# Patient Record
Sex: Male | Born: 1990
Health system: Southern US, Community
[De-identification: ages and names within clinical notes are randomized; demographics above are authoritative.]

## PROBLEM LIST (undated history)

## (undated) DIAGNOSIS — K219 Gastro-esophageal reflux disease without esophagitis: Secondary | ICD-10-CM

## (undated) DIAGNOSIS — E785 Hyperlipidemia, unspecified: Secondary | ICD-10-CM

## (undated) DIAGNOSIS — F988 Other specified behavioral and emotional disorders with onset usually occurring in childhood and adolescence: Secondary | ICD-10-CM

## (undated) HISTORY — DX: Gastro-esophageal reflux disease without esophagitis: K21.9

## (undated) HISTORY — DX: Hyperlipidemia, unspecified: E78.5

## (undated) HISTORY — DX: Other specified behavioral and emotional disorders with onset usually occurring in childhood and adolescence: F98.8

---
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
## 2015-03-10 ENCOUNTER — Other Ambulatory Visit: Payer: Self-pay | Admitting: Nurse Practitioner

## 2015-03-10 DIAGNOSIS — K219 Gastro-esophageal reflux disease without esophagitis: Secondary | ICD-10-CM

## 2015-03-27 ENCOUNTER — Ambulatory Visit
Admission: RE | Admit: 2015-03-27 | Discharge: 2015-03-27 | Disposition: A | Discharge status: Home / Self Care | Payer: BLUE CROSS/BLUE SHIELD | Source: Ambulatory Visit | Attending: Nurse Practitioner | Admitting: Nurse Practitioner

## 2015-03-27 DIAGNOSIS — K219 Gastro-esophageal reflux disease without esophagitis: Secondary | ICD-10-CM | POA: Insufficient documentation

## 2016-10-01 IMAGING — RF DG UGI W/O KUB
8 of 9 series · 15 of 16 positions shown · non-contrast
Comparison: None.

CLINICAL DATA: Reflux.

EXAM:
UPPER GI SERIES WITHOUT KUB
TECHNIQUE: Routine upper GI series was performed with thin and high density
barium.
FLUOROSCOPY TIME:  Fluoroscopy Time (in minutes and seconds): 1
minutes 6 seconds
Number of Acquired Images:  16

[Series 1: fluoro_barium 2fps_bw · 0.19mm/px · 4 of 5 frames shown (1 of 8)]
[frame 1/5]
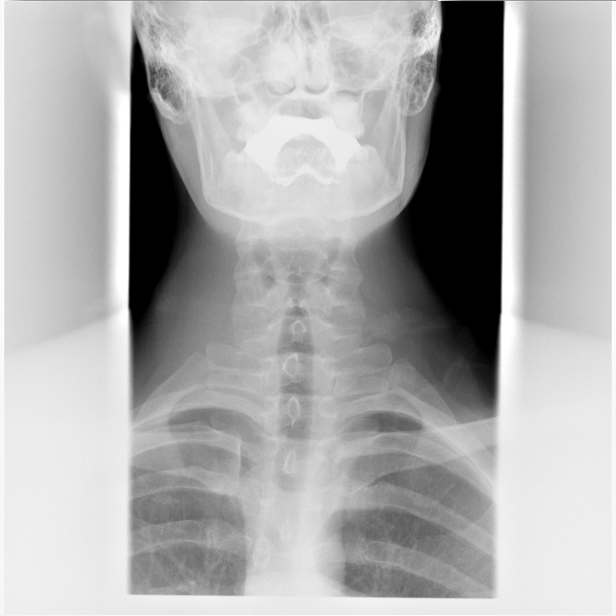
[frame 2/5]
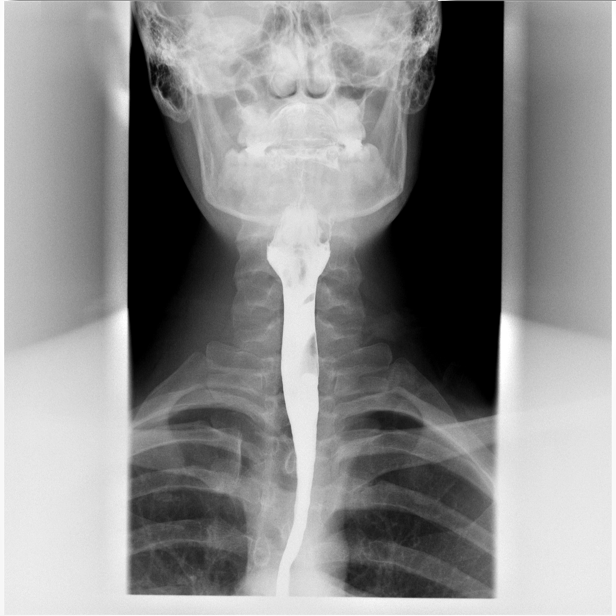
[frame 3/5]
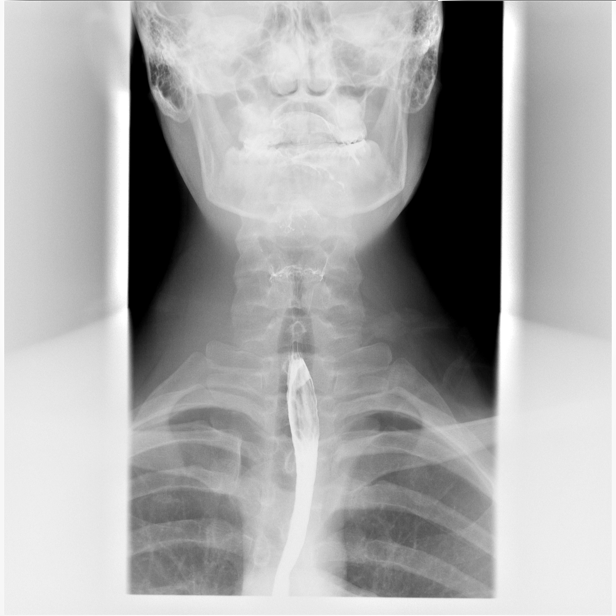
[frame 5/5]
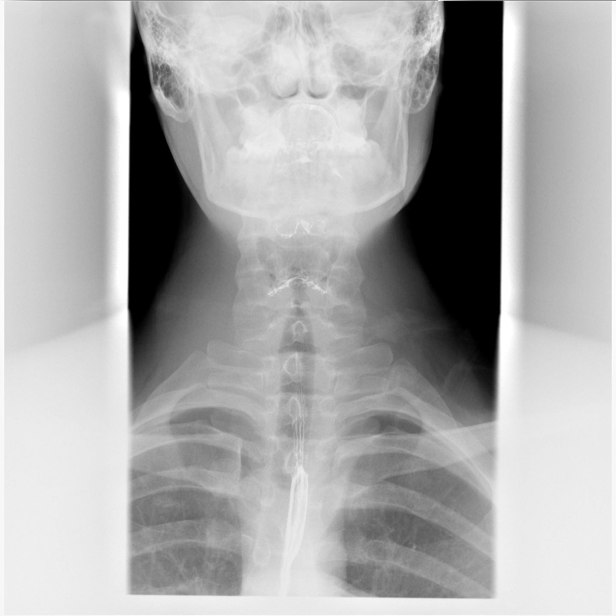

[Series 2: fluoro_barium 2fps_bw · 0.19mm/px · 3 of 5 frames shown (2 of 8)]
[frame 1/5]
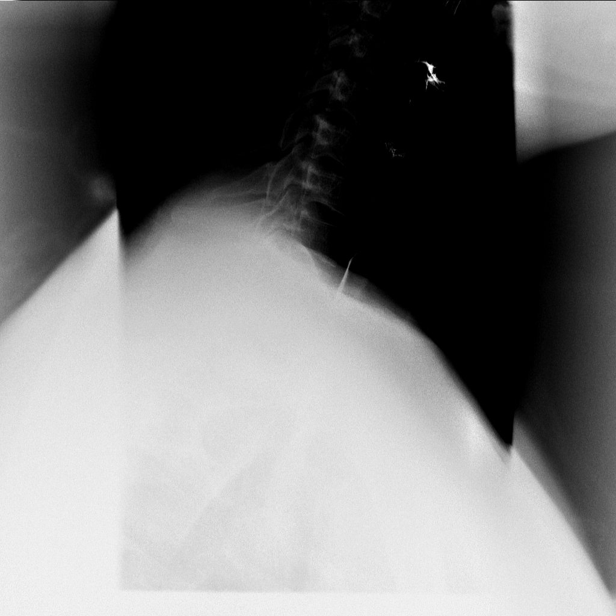
[frame 3/5]
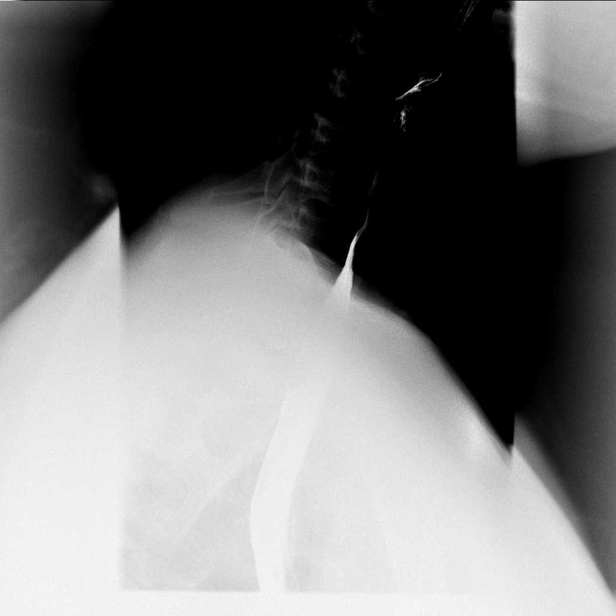
[frame 5/5]
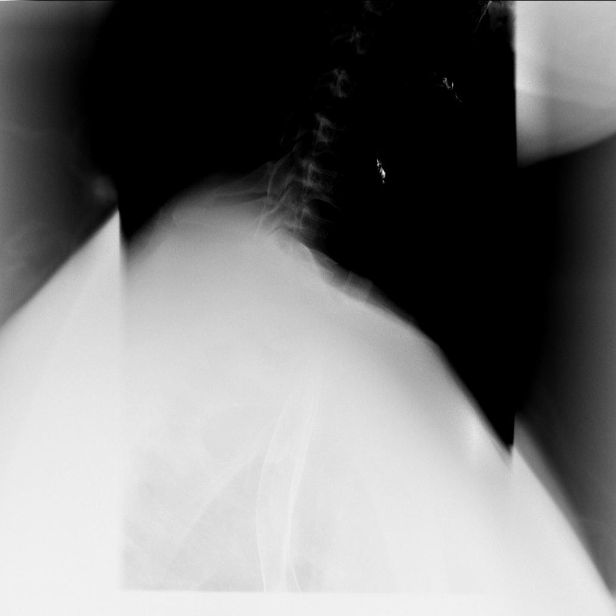

[Series 4: fluoro_barium 2fps_bw · 0.19mm/px · 1 of 1 slices shown (3 of 8)]
[im 1/1]
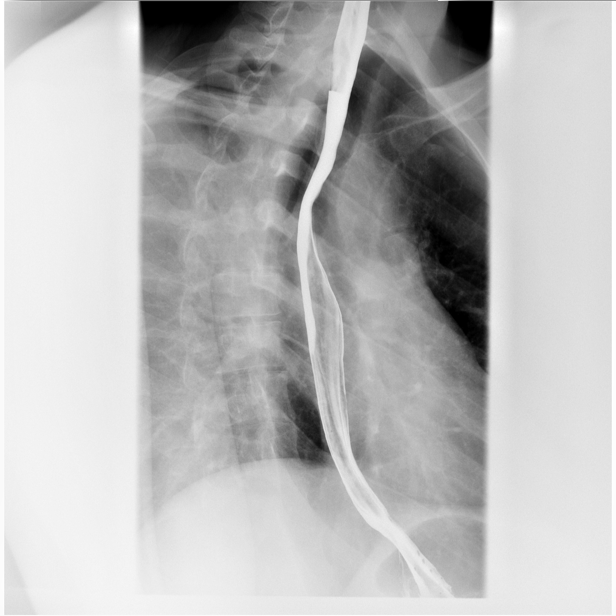

[Series 5: fluoro_barium 2fps_bw · 0.19mm/px · 1 of 1 slices shown (4 of 8)]
[im 1/1]
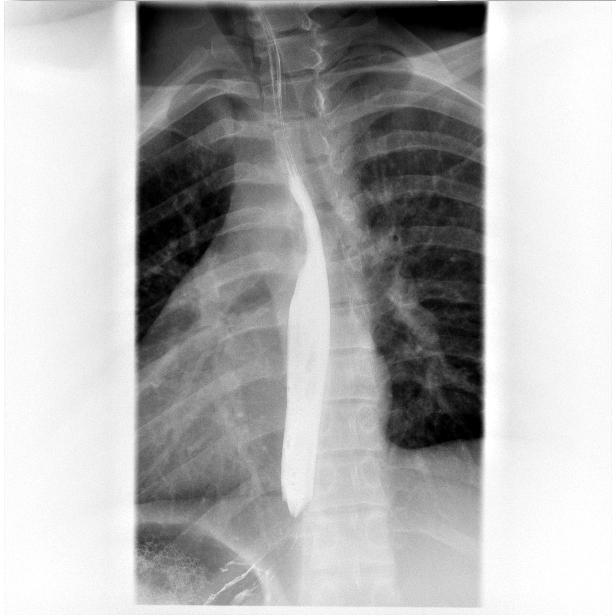

[Series 6: fluoro_barium 2fps_bw · 0.19mm/px · 1 of 1 slices shown (5 of 8)]
[im 1/1]
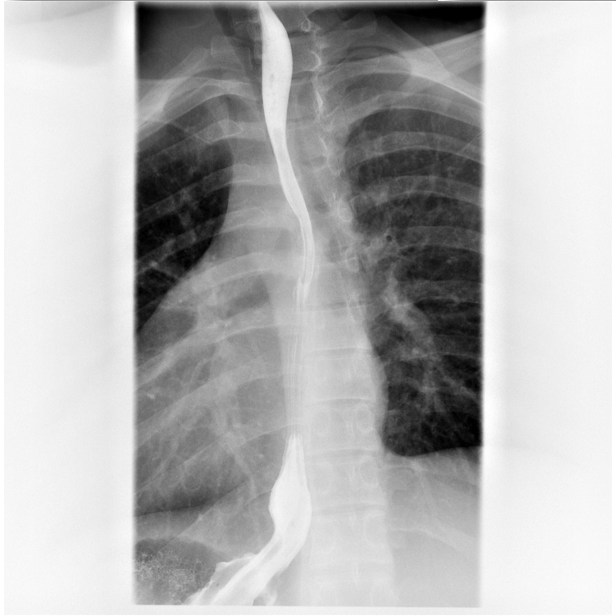

[Series 7: fluoro_barium 2fps_bw · 0.19mm/px · 1 of 1 slices shown (6 of 8)]
[im 1/1]
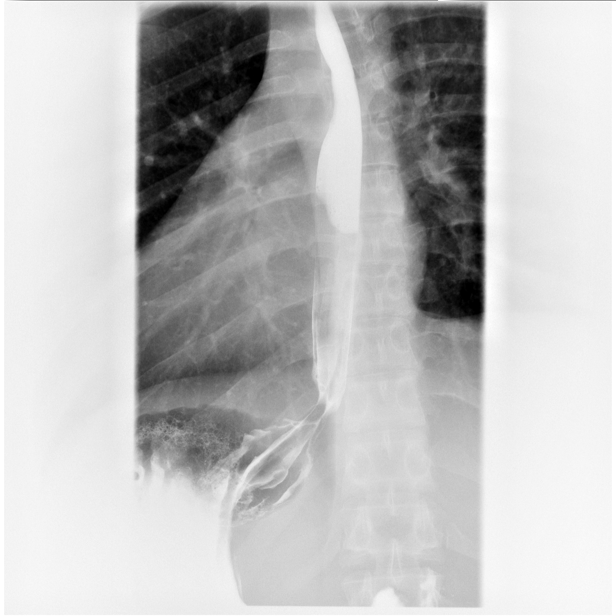

[Series 8: fluoro_barium 2fps_bw · 0.19mm/px · 1 of 1 slices shown (7 of 8)]
[im 1/1]
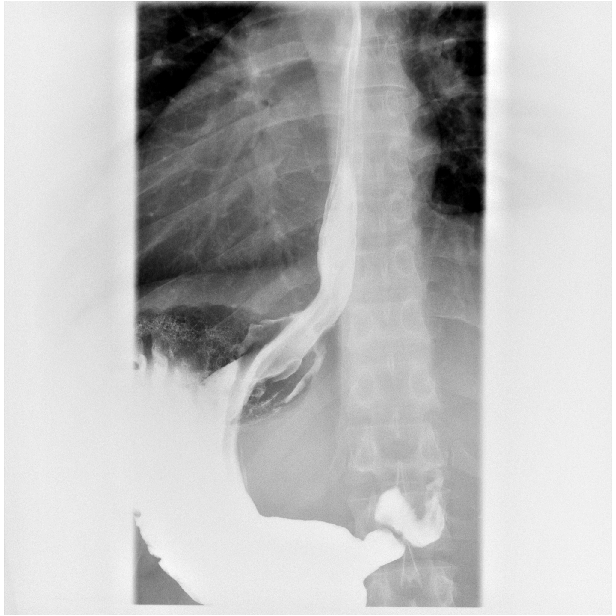

[Series 9: fluoro_barium 2fps_bw · 0.19mm/px · 3 of 3 frames shown (8 of 8)]
[frame 1/3]
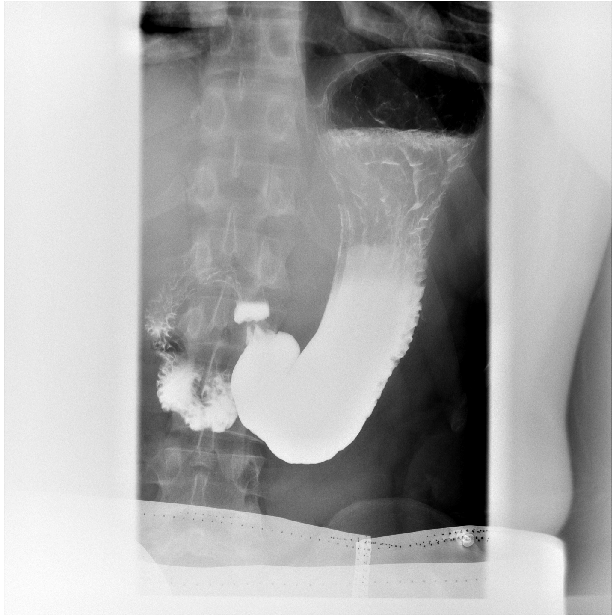
[frame 2/3]
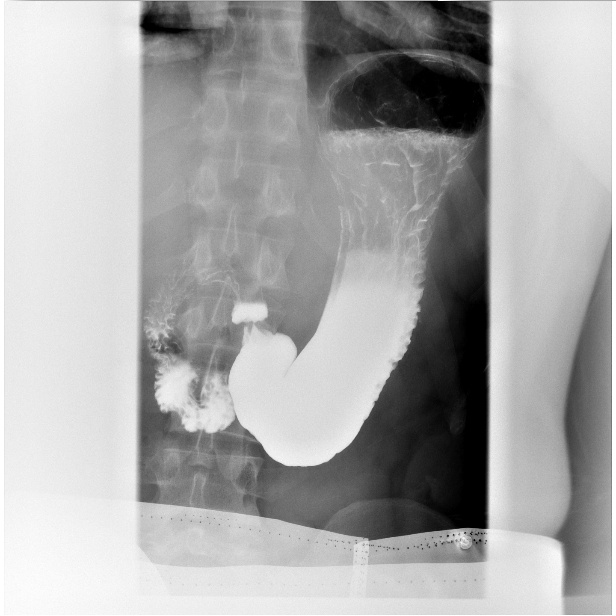
[frame 3/3]
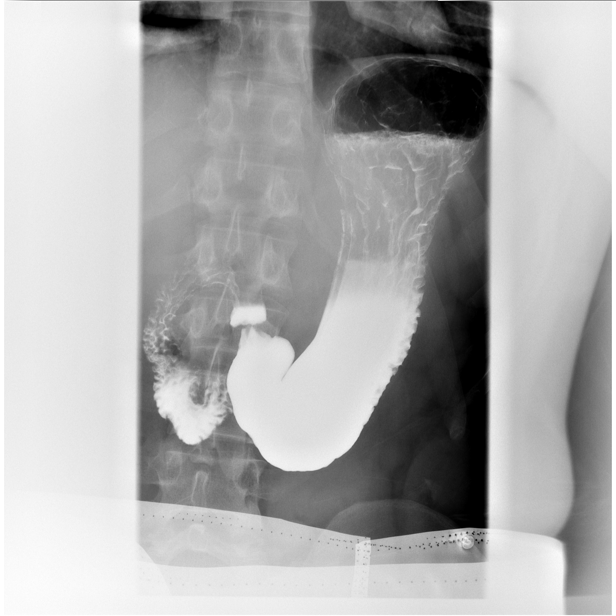

[15 of 16 positions shown; findings below may reference images not displayed]

FINDINGS: Cervical and thoracic esophageal mucosal pattern and peristaltic
activity normal. Contour normal. No focal obstructing abnormality.
No reflux. No hiatal hernia.
IMPRESSION: Normal exam.

## 2016-11-07 DIAGNOSIS — K011 Impacted teeth: Secondary | ICD-10-CM | POA: Diagnosis not present

## 2016-11-29 DIAGNOSIS — F900 Attention-deficit hyperactivity disorder, predominantly inattentive type: Secondary | ICD-10-CM | POA: Diagnosis not present

## 2017-02-27 DIAGNOSIS — K219 Gastro-esophageal reflux disease without esophagitis: Secondary | ICD-10-CM | POA: Diagnosis not present

## 2017-02-27 DIAGNOSIS — F900 Attention-deficit hyperactivity disorder, predominantly inattentive type: Secondary | ICD-10-CM | POA: Diagnosis not present

## 2017-06-10 DIAGNOSIS — J329 Chronic sinusitis, unspecified: Secondary | ICD-10-CM | POA: Diagnosis not present

## 2017-06-10 DIAGNOSIS — B9689 Other specified bacterial agents as the cause of diseases classified elsewhere: Secondary | ICD-10-CM | POA: Diagnosis not present

## 2017-06-18 DIAGNOSIS — Z79891 Long term (current) use of opiate analgesic: Secondary | ICD-10-CM | POA: Diagnosis not present

## 2017-06-18 DIAGNOSIS — K219 Gastro-esophageal reflux disease without esophagitis: Secondary | ICD-10-CM | POA: Diagnosis not present

## 2017-06-18 DIAGNOSIS — F900 Attention-deficit hyperactivity disorder, predominantly inattentive type: Secondary | ICD-10-CM | POA: Diagnosis not present

## 2017-09-22 ENCOUNTER — Encounter: Payer: Self-pay | Admitting: Nurse Practitioner

## 2017-09-22 ENCOUNTER — Ambulatory Visit: Payer: BLUE CROSS/BLUE SHIELD | Admitting: Nurse Practitioner

## 2017-09-22 VITALS — BP 130/80 | HR 90 | Resp 16 | Ht 73.0 in | Wt 216.0 lb

## 2017-09-22 DIAGNOSIS — K219 Gastro-esophageal reflux disease without esophagitis: Secondary | ICD-10-CM

## 2017-09-22 DIAGNOSIS — F988 Other specified behavioral and emotional disorders with onset usually occurring in childhood and adolescence: Secondary | ICD-10-CM | POA: Diagnosis not present

## 2017-09-22 MED ORDER — METHYLPHENIDATE HCL ER (OSM) 54 MG PO TBCR: 54.0000 mg | EXTENDED_RELEASE_TABLET | Freq: Every day | ORAL | 0 refills | Status: DC

## 2017-09-22 NOTE — Progress Notes (Signed)
Subjective:     Patient ID: Gary Price, male   DOB: 01-19-1991, 26 y.o.   MRN: [id number]  The patient is here for medication follow up. Taking concertal ER 54mg  daily. Needs to have new prescriptions for this today. He has no physical concerns or complaints.      Review of Systems  Constitutional: Negative.   HENT: Negative.   Eyes: Negative.   Respiratory: Negative.   Gastrointestinal: Negative for constipation, diarrhea and vomiting.       Does have intermittent acid regflux. Dong well overall. Avoids triggers when possible.   Endocrine: Negative.   Genitourinary: Negative.   Musculoskeletal: Negative.   Skin: Negative.   Allergic/Immunologic: Negative.   Neurological: Negative.   Psychiatric/Behavioral: Positive for decreased concentration.       Objective:   Physical Exam  Constitutional: He appears well-developed and well-nourished.  HENT:  Head: Normocephalic.  Eyes: Pupils are equal, round, and reactive to light.  Neck: Normal range of motion. Neck supple.  Cardiovascular: Normal rate.  Pulmonary/Chest: Effort normal and breath sounds normal.  Abdominal: Soft. Bowel sounds are normal.  Musculoskeletal: Normal range of motion.  Neurological: He is alert.  Skin: Skin is warm and dry.  Psychiatric: He has a normal mood and affect. His behavior is normal.       Assessment:     Attention deficit disorder (ADD) in adult - Plan: methylphenidate 54 MG PO CR tablet, DISCONTINUED: methylphenidate 54 MG PO CR tablet, DISCONTINUED: methylphenidate 54 MG PO CR tablet  GERD without esophagitis     Plan:      1. Add - renew Concerta ER 54mg  daily. Three 30 day prescriptions were sent to the pharmacy - dates are 09/22/2017, 10/21/2017, and 11/19/2017 2. GERD - continue lansopazole 30mg  daily. Use zantac 75mg  twice daily if needed. Updated medication in chart.   Follow up 3 months and sooner if needed

## 2017-12-22 ENCOUNTER — Ambulatory Visit: Payer: Self-pay | Admitting: Nurse Practitioner

## 2018-01-02 ENCOUNTER — Other Ambulatory Visit: Payer: Self-pay | Admitting: Nurse Practitioner

## 2018-01-02 ENCOUNTER — Telehealth: Payer: Self-pay

## 2018-01-02 DIAGNOSIS — F988 Other specified behavioral and emotional disorders with onset usually occurring in childhood and adolescence: Secondary | ICD-10-CM

## 2018-01-02 MED ORDER — METHYLPHENIDATE HCL ER (OSM) 54 MG PO TBCR: 54.0000 mg | EXTENDED_RELEASE_TABLET | Freq: Every day | ORAL | 0 refills | Status: DC

## 2018-01-02 NOTE — Telephone Encounter (Signed)
Renew concerta for 1 month and sent to [facility]

## 2018-01-02 NOTE — Progress Notes (Signed)
Renew concerta for 1 month and sent to [facility]

## 2018-01-02 NOTE — Telephone Encounter (Signed)
Pt advised that rx was sent to pharmacy for his concerta.  dbs

## 2018-01-07 DIAGNOSIS — Z3141 Encounter for fertility testing: Secondary | ICD-10-CM | POA: Diagnosis not present

## 2018-02-03 ENCOUNTER — Ambulatory Visit: Payer: BLUE CROSS/BLUE SHIELD | Admitting: Nurse Practitioner

## 2018-02-03 ENCOUNTER — Encounter: Payer: Self-pay | Admitting: Nurse Practitioner

## 2018-02-03 DIAGNOSIS — K219 Gastro-esophageal reflux disease without esophagitis: Secondary | ICD-10-CM

## 2018-02-03 DIAGNOSIS — F988 Other specified behavioral and emotional disorders with onset usually occurring in childhood and adolescence: Secondary | ICD-10-CM

## 2018-02-03 MED ORDER — METHYLPHENIDATE HCL ER (OSM) 54 MG PO TBCR: 54.0000 mg | EXTENDED_RELEASE_TABLET | Freq: Every day | ORAL | 0 refills | Status: DC

## 2018-02-03 MED ORDER — METHYLPHENIDATE HCL ER (OSM) 54 MG PO TBCR
54.0000 mg | EXTENDED_RELEASE_TABLET | Freq: Every day | ORAL | 0 refills | Status: DC
Start: 2018-02-03 — End: 2018-02-03

## 2018-02-03 NOTE — Progress Notes (Signed)
[facility] [place], [place] [address]  Internal MEDICINE  Office Visit Note  Patient Name: Gary Price  [id number]  [id number]  Date of Service: 02/23/2018   Pt is here for routine follow up.   Chief Complaint  Patient presents with  . Medication Refill    concerta    The patient is currently taking Concerta XR 54mg  daily. This medication helps to keep him focused and on track while at work. 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 Taking concerta helps him to concentrate and do his job in safe manner. He does need o have refills today.   Medication Refill  Pertinent negatives include no abdominal pain, arthralgias, chest pain, chills, congestion, coughing, fatigue, headaches, joint swelling, myalgias, nausea, neck pain, numbness, rash, sore throat or vomiting.       Current Medication: Outpatient Encounter Medications as of 02/03/2018  Medication Sig  . lansoprazole (PREVACID) 30 MG capsule Take 30 mg by mouth daily at 12 noon.  . methylphenidate 54 MG PO CR tablet Take 1 tablet (54 mg total) by mouth daily. Take 1 tab po daily  . ranitidine (ZANTAC) 75 MG tablet Take 75 mg by mouth 2 (two) times daily.  . [DISCONTINUED] methylphenidate 54 MG PO CR tablet Take 1 tablet (54 mg total) by mouth daily. Take 1 tab po daily  . [DISCONTINUED] methylphenidate 54 MG PO CR tablet Take 1 tablet (54 mg total) by mouth daily. Take 1 tab po daily  . [DISCONTINUED] methylphenidate 54 MG PO CR tablet Take 1 tablet (54 mg total) by mouth daily. Take 1 tab po daily   No facility-administered encounter medications on file as of 02/03/2018.     Surgical History: History reviewed. No pertinent surgical history.  Medical History: Past Medical History:  Diagnosis Date  . ADD (attention deficit disorder)   . GERD (gastroesophageal reflux disease)     Family History: Family  History  Problem Relation Age of Onset  . Diabetes Father   . Cancer Paternal Grandmother     Social History   Socioeconomic History  . Marital status: Single    Spouse name: Not on file  . Number of children: Not on file  . Years of education: Not on file  . Highest education level: Not on file  Occupational History  . Not on file  Social Needs  . Financial resource strain: Not on file  . Food insecurity:    Worry: Not on file    Inability: Not on file  . Transportation needs:    Medical: Not on file    Non-medical: Not on file  Tobacco Use  . Smoking status: Never Smoker  . Smokeless tobacco: Never Used  Substance and Sexual Activity  . Alcohol use: No    Frequency: Never  . Drug use: No  . Sexual activity: Not on file  Lifestyle  . Physical activity:    Days per week: Not on file    Minutes per session: Not on file  . Stress: Not on file  Relationships  . Social connections:    Talks on phone: Not on file    Gets together: Not on file    Attends religious service: Not on file    Active member of club or organization: Not on file    Attends meetings of clubs or organizations: Not on file  Relationship status: Not on file  . Intimate partner violence:    Fear of current or ex partner: Not on file    Emotionally abused: Not on file    Physically abused: Not on file    Forced sexual activity: Not on file  Other Topics Concern  . Not on file  Social History Narrative  . Not on file      Review of Systems  Constitutional: Negative for activity change, chills, fatigue and unexpected weight change.  HENT: Negative for congestion, postnasal drip, rhinorrhea, sneezing and sore throat.   Eyes: Negative.  Negative for redness.  Respiratory: Negative for cough, chest tightness, shortness of breath and wheezing.   Cardiovascular: Negative for chest pain and palpitations.  Gastrointestinal: Negative for abdominal pain, constipation, diarrhea, nausea and vomiting.        Intermittent acid reflux.   Endocrine: Negative for cold intolerance, heat intolerance, polydipsia, polyphagia and polyuria.  Genitourinary: Negative for dysuria and frequency.  Musculoskeletal: Negative for arthralgias, back pain, joint swelling, myalgias and neck pain.  Skin: Negative for rash.  Allergic/Immunologic: Negative for environmental allergies.  Neurological: Negative for dizziness, tremors, numbness and headaches.  Hematological: Negative for adenopathy. Does not bruise/bleed easily.  Psychiatric/Behavioral: Positive for decreased concentration. Negative for behavioral problems (Depression), sleep disturbance and suicidal ideas. The patient is not nervous/anxious.     Vital Signs: Today's Vitals   02/03/18 1134  BP: 132/88  Pulse: 88  Resp: 16  SpO2: 95%  Weight: 220 lb 12.8 oz (100.2 kg)  Height: 6\' 3"  (1.905 m)    Physical Exam  Constitutional: He is oriented to person, place, and time. He appears well-developed and well-nourished. No distress.  HENT:  Head: Normocephalic and atraumatic.  Mouth/Throat: Oropharynx is clear and moist. No oropharyngeal exudate.  Eyes: Pupils are equal, round, and reactive to light. EOM are normal.  Neck: Normal range of motion. Neck supple. No JVD present. No tracheal deviation present. No thyromegaly present.  Cardiovascular: Normal rate, regular rhythm and normal heart sounds. Exam reveals no gallop and no friction rub.  No murmur heard. Pulmonary/Chest: Effort normal and breath sounds normal. No respiratory distress. He has no wheezes. He has no rales. He exhibits no tenderness.  Abdominal: Soft. Bowel sounds are normal. There is no tenderness.  Musculoskeletal: Normal range of motion.  Lymphadenopathy:    He has no cervical adenopathy.  Neurological: He is alert and oriented to person, place, and time. No cranial nerve deficit.  Skin: Skin is warm and dry. He is not diaphoretic.  Psychiatric: He has a normal mood and  affect. His behavior is normal. Judgment and thought content normal.  Nursing note and vitals reviewed.  Assessment/Plan: 1. Attention deficit disorder (ADD) in adult Refill concertal 54mg  daily. Three 30 day prescriptions were sent to his pharmacy. Dates are 02/03/2018, 03/03/2018, and 04/01/2018 - methylphenidate 54 MG PO CR tablet; Take 1 tablet (54 mg total) by mouth daily. Take 1 tab po daily  Dispense: 30 tablet; Refill: 0  2. Gastroesophageal reflux disease without esophagitis Continue lansoprazole every day. Use Zantac as needed and as prescribed.   General Counseling: Casimiro Needle understanding of the findings of todays visit and agrees with plan of treatment. I have discussed any further diagnostic evaluation that may be needed or ordered today. We also reviewed his medications today. he has been encouraged to call the office with any questions or concerns that should arise related to todays visit.  This patient was seen by Nira Conn  Junius Creamer, [place] in Collaboration with Dr Lavera Guise as a part of collaborative care agreement  Meds ordered this encounter  Medications  . DISCONTD: methylphenidate 54 MG PO CR tablet    Sig: Take 1 tablet (54 mg total) by mouth daily. Take 1 tab po daily    Dispense:  30 tablet    Refill:  0    Order Specific Question:   Supervising Provider    Answer:   Lavera Guise [[id number]]  . DISCONTD: methylphenidate 54 MG PO CR tablet    Sig: Take 1 tablet (54 mg total) by mouth daily. Take 1 tab po daily    Dispense:  30 tablet    Refill:  0    Fill after 03/03/2018    Order Specific Question:   Supervising Provider    Answer:   Lavera Guise [place]  . methylphenidate 54 MG PO CR tablet    Sig: Take 1 tablet (54 mg total) by mouth daily. Take 1 tab po daily    Dispense:  30 tablet    Refill:  0    Fill after 04/01/2018    Order Specific Question:   Supervising Provider    Answer:   Lavera Guise [[id number]]    Time spent: 17 Minutes     Dr Lavera Guise Internal medicine

## 2018-02-23 ENCOUNTER — Encounter: Payer: Self-pay | Admitting: Nurse Practitioner

## 2018-02-23 DIAGNOSIS — F988 Other specified behavioral and emotional disorders with onset usually occurring in childhood and adolescence: Secondary | ICD-10-CM | POA: Insufficient documentation

## 2018-02-23 DIAGNOSIS — K219 Gastro-esophageal reflux disease without esophagitis: Secondary | ICD-10-CM | POA: Insufficient documentation

## 2018-03-04 DIAGNOSIS — J029 Acute pharyngitis, unspecified: Secondary | ICD-10-CM | POA: Diagnosis not present

## 2018-03-07 DIAGNOSIS — K121 Other forms of stomatitis: Secondary | ICD-10-CM | POA: Diagnosis not present

## 2018-03-24 ENCOUNTER — Other Ambulatory Visit: Payer: Self-pay

## 2018-03-24 MED ORDER — LANSOPRAZOLE 30 MG PO CPDR: 30.0000 mg | DELAYED_RELEASE_CAPSULE | Freq: Every day | ORAL | 5 refills | Status: DC

## 2018-04-17 DIAGNOSIS — S8001XA Contusion of right knee, initial encounter: Secondary | ICD-10-CM | POA: Diagnosis not present

## 2018-04-17 DIAGNOSIS — M25561 Pain in right knee: Secondary | ICD-10-CM | POA: Diagnosis not present

## 2018-05-11 ENCOUNTER — Ambulatory Visit: Payer: BLUE CROSS/BLUE SHIELD | Admitting: Nurse Practitioner

## 2018-05-11 ENCOUNTER — Encounter: Payer: Self-pay | Admitting: Nurse Practitioner

## 2018-05-11 VITALS — BP 143/82 | HR 81 | Resp 16 | Ht 75.0 in | Wt 213.0 lb

## 2018-05-11 DIAGNOSIS — K219 Gastro-esophageal reflux disease without esophagitis: Secondary | ICD-10-CM

## 2018-05-11 DIAGNOSIS — F988 Other specified behavioral and emotional disorders with onset usually occurring in childhood and adolescence: Secondary | ICD-10-CM

## 2018-05-11 MED ORDER — METHYLPHENIDATE HCL ER (OSM) 54 MG PO TBCR: 54.0000 mg | EXTENDED_RELEASE_TABLET | Freq: Every day | ORAL | 0 refills | Status: DC

## 2018-05-11 MED ORDER — METHYLPHENIDATE HCL ER (OSM) 54 MG PO TBCR
54.0000 mg | EXTENDED_RELEASE_TABLET | Freq: Every day | ORAL | 0 refills | Status: DC
Start: 2018-05-11 — End: 2018-07-09

## 2018-05-11 MED ORDER — LANSOPRAZOLE 30 MG PO CPDR: 30.0000 mg | DELAYED_RELEASE_CAPSULE | Freq: Every day | ORAL | 5 refills | Status: DC

## 2018-05-11 NOTE — Progress Notes (Signed)
[facility] [place], [place] [address]  Internal MEDICINE  Office Visit Note  Patient Name: Gary Price  [id number]  [id number]  Date of Service: 05/11/2018  Chief Complaint  Patient presents with  . ADD    medication refill    The patient is currently taking Concerta XR 54mg  daily. This medication helps to keep him focused and on track while at work. 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 Taking concerta helps him to concentrate and do his job in safe manner. He does need o have refills today.   Medication Refill  Pertinent negatives include no abdominal pain, arthralgias, chest pain, chills, congestion, coughing, fatigue, headaches, joint swelling, myalgias, nausea, neck pain, numbness, rash, sore throat or vomiting.       Current Medication: Outpatient Encounter Medications as of 05/11/2018  Medication Sig  . lansoprazole (PREVACID) 30 MG capsule Take 1 capsule (30 mg total) by mouth daily at 12 noon.  . methylphenidate 54 MG PO CR tablet Take 1 tablet (54 mg total) by mouth daily. Take 1 tab po daily  . ranitidine (ZANTAC) 75 MG tablet Take 75 mg by mouth 2 (two) times daily.  . [DISCONTINUED] lansoprazole (PREVACID) 30 MG capsule Take 1 capsule (30 mg total) by mouth daily at 12 noon.  . [DISCONTINUED] methylphenidate 54 MG PO CR tablet Take 1 tablet (54 mg total) by mouth daily. Take 1 tab po daily  . [DISCONTINUED] methylphenidate 54 MG PO CR tablet Take 1 tablet (54 mg total) by mouth daily. Take 1 tab po daily  . [DISCONTINUED] methylphenidate 54 MG PO CR tablet Take 1 tablet (54 mg total) by mouth daily. Take 1 tab po daily   No facility-administered encounter medications on file as of 05/11/2018.     Surgical History: History reviewed. No pertinent surgical history.  Medical History: Past Medical History:  Diagnosis Date  . ADD (attention deficit  disorder)   . GERD (gastroesophageal reflux disease)     Family History: Family History  Problem Relation Age of Onset  . Diabetes Father   . Cancer Paternal Grandmother     Social History   Socioeconomic History  . Marital status: Single    Spouse name: Not on file  . Number of children: Not on file  . Years of education: Not on file  . Highest education level: Not on file  Occupational History  . Not on file  Social Needs  . Financial resource strain: Not on file  . Food insecurity:    Worry: Not on file    Inability: Not on file  . Transportation needs:    Medical: Not on file    Non-medical: Not on file  Tobacco Use  . Smoking status: Never Smoker  . Smokeless tobacco: Never Used  Substance and Sexual Activity  . Alcohol use: No    Frequency: Never  . Drug use: No  . Sexual activity: Not on file  Lifestyle  . Physical activity:    Days per week: Not on file    Minutes per session: Not on file  . Stress: Not on file  Relationships  . Social connections:    Talks on phone: Not on file    Gets together: Not on file    Attends religious service: Not on file    Active member of club or organization: Not on  file    Attends meetings of clubs or organizations: Not on file    Relationship status: Not on file  . Intimate partner violence:    Fear of current or ex partner: Not on file    Emotionally abused: Not on file    Physically abused: Not on file    Forced sexual activity: Not on file  Other Topics Concern  . Not on file  Social History Narrative  . Not on file      Review of Systems  Constitutional: Negative for activity change, chills, fatigue and unexpected weight change.  HENT: Negative for congestion, postnasal drip, rhinorrhea, sneezing and sore throat.   Eyes: Negative.  Negative for redness.  Respiratory: Negative for cough, chest tightness, shortness of breath and wheezing.   Cardiovascular: Negative for chest pain and palpitations.   Gastrointestinal: Negative for abdominal pain, constipation, diarrhea, nausea and vomiting.       Intermittent acid reflux.   Endocrine: Negative for cold intolerance, heat intolerance, polydipsia, polyphagia and polyuria.  Genitourinary: Negative for dysuria and frequency.  Musculoskeletal: Negative for arthralgias, back pain, joint swelling, myalgias and neck pain.  Skin: Negative for rash.  Allergic/Immunologic: Negative for environmental allergies.  Neurological: Negative for dizziness, tremors, numbness and headaches.  Hematological: Negative for adenopathy. Does not bruise/bleed easily.  Psychiatric/Behavioral: Positive for decreased concentration. Negative for behavioral problems (Depression), sleep disturbance and suicidal ideas. The patient is not nervous/anxious.     Vital Signs: BP (!) 143/82 (BP Location: Right Arm, Patient Position: Sitting, Cuff Size: Large)   Pulse 81   Resp 16   Ht 6\' 3"  (1.905 m)   Wt 213 lb (96.6 kg)   SpO2 98%   BMI 26.62 kg/m    Physical Exam  Constitutional: He is oriented to person, place, and time. He appears well-developed and well-nourished. No distress.  HENT:  Head: Normocephalic and atraumatic.  Mouth/Throat: Oropharynx is clear and moist. No oropharyngeal exudate.  Eyes: Pupils are equal, round, and reactive to light. EOM are normal.  Neck: Normal range of motion. Neck supple. No JVD present. No tracheal deviation present. No thyromegaly present.  Cardiovascular: Normal rate, regular rhythm and normal heart sounds. Exam reveals no gallop and no friction rub.  No murmur heard. Pulmonary/Chest: Effort normal and breath sounds normal. No respiratory distress. He has no wheezes. He has no rales. He exhibits no tenderness.  Abdominal: Soft. Bowel sounds are normal. There is no tenderness.  Musculoskeletal: Normal range of motion.  Lymphadenopathy:    He has no cervical adenopathy.  Neurological: He is alert and oriented to person, place,  and time. No cranial nerve deficit.  Skin: Skin is warm and dry. He is not diaphoretic.  Psychiatric: He has a normal mood and affect. His behavior is normal. Judgment and thought content normal.  Nursing note and vitals reviewed.   Assessment/Plan:  1. Gastroesophageal reflux disease without esophagitis Prevacid 30mg  daily. May use zantac as needed for acute GERD symptoms.  - lansoprazole (PREVACID) 30 MG capsule; Take 1 capsule (30 mg total) by mouth daily at 12 noon.  Dispense: 30 capsule; Refill: 5  2. Attention deficit disorder (ADD) in adult conitnue concerta 54mg  daily when needed for work. Three 30 day prescriptions given today. Dates are 05/11/2018, 06/09/2018, and 07/07/2018. - methylphenidate 54 MG PO CR tablet; Take 1 tablet (54 mg total) by mouth daily. Take 1 tab po daily  Dispense: 30 tablet; Refill: 0   General Counseling: Mohammedali verbalizes understanding of the  findings of todays visit and agrees with plan of treatment. I have discussed any further diagnostic evaluation that may be needed or ordered today. We also reviewed his medications today. he has been encouraged to call the office with any questions or concerns that should arise related to todays visit.   This patient was seen by [place] with Dr Lavera Guise as a part of collaborative care agreement  Meds ordered this encounter  Medications  . lansoprazole (PREVACID) 30 MG capsule    Sig: Take 1 capsule (30 mg total) by mouth daily at 12 noon.    Dispense:  30 capsule    Refill:  5    Order Specific Question:   Supervising Provider    Answer:   Lavera Guise [[id number]]  . DISCONTD: methylphenidate 54 MG PO CR tablet    Sig: Take 1 tablet (54 mg total) by mouth daily. Take 1 tab po daily    Dispense:  30 tablet    Refill:  0    Order Specific Question:   Supervising Provider    Answer:   Lavera Guise [[id number]]  . DISCONTD: methylphenidate 54 MG PO CR tablet    Sig: Take 1 tablet (54 mg total)  by mouth daily. Take 1 tab po daily    Dispense:  30 tablet    Refill:  0    Fill after 06/09/2018    Order Specific Question:   Supervising Provider    Answer:   Lavera Guise [place]  . methylphenidate 54 MG PO CR tablet    Sig: Take 1 tablet (54 mg total) by mouth daily. Take 1 tab po daily    Dispense:  30 tablet    Refill:  0    Fill after 07/07/2018    Order Specific Question:   Supervising Provider    Answer:   Lavera Guise [[id number]]    Time spent: 52 Minutes      Dr Lavera Guise Internal medicine

## 2018-05-18 ENCOUNTER — Ambulatory Visit: Payer: Self-pay | Admitting: Adult Health

## 2018-07-09 ENCOUNTER — Telehealth: Payer: Self-pay

## 2018-07-09 ENCOUNTER — Other Ambulatory Visit: Payer: Self-pay | Admitting: Adult Health

## 2018-07-09 DIAGNOSIS — F988 Other specified behavioral and emotional disorders with onset usually occurring in childhood and adolescence: Secondary | ICD-10-CM

## 2018-07-09 MED ORDER — METHYLPHENIDATE HCL ER (OSM) 54 MG PO TBCR: 54.0000 mg | EXTENDED_RELEASE_TABLET | Freq: Every day | ORAL | 0 refills | Status: DC

## 2018-07-09 NOTE — Telephone Encounter (Signed)
Advised pt we send med

## 2018-07-10 ENCOUNTER — Encounter: Payer: Self-pay | Admitting: Adult Health

## 2018-07-10 ENCOUNTER — Ambulatory Visit: Payer: BLUE CROSS/BLUE SHIELD | Admitting: Adult Health

## 2018-07-10 VITALS — BP 138/80 | HR 89 | Resp 16 | Ht 75.0 in | Wt 210.0 lb

## 2018-07-10 DIAGNOSIS — K219 Gastro-esophageal reflux disease without esophagitis: Secondary | ICD-10-CM | POA: Diagnosis not present

## 2018-07-10 DIAGNOSIS — F988 Other specified behavioral and emotional disorders with onset usually occurring in childhood and adolescence: Secondary | ICD-10-CM | POA: Diagnosis not present

## 2018-07-10 MED ORDER — METHYLPHENIDATE HCL ER 54 MG PO TB24: 1.0000 | ORAL_TABLET | Freq: Every day | ORAL | 0 refills | Status: DC

## 2018-07-10 MED ORDER — METHYLPHENIDATE HCL ER (OSM) 54 MG PO TBCR: 54.0000 mg | EXTENDED_RELEASE_TABLET | Freq: Every day | ORAL | 0 refills | Status: DC

## 2018-07-10 NOTE — Progress Notes (Signed)
[facility] [place], [place] [address]  Internal MEDICINE  Office Visit Note  Patient Name: Gary Price  [id number]  [id number]  Date of Service: 07/13/2018  Chief Complaint  Patient presents with  . ADD  . Gastroesophageal Reflux    HPI Pt here for follow up ADD and GERD. He reports he uses Concerta for concentration and attention.  He reports good resolution of symptoms with this medicine.  He has been on the Concerta for at least 20 years. 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  He denies chest pain, palpitations, or headaches from medication.      Current Medication: Outpatient Encounter Medications as of 07/10/2018  Medication Sig  . lansoprazole (PREVACID) 30 MG capsule Take 1 capsule (30 mg total) by mouth daily at 12 noon.  . methylphenidate 54 MG PO CR tablet Take 1 tablet (54 mg total) by mouth daily.  . ranitidine (ZANTAC) 75 MG tablet Take 75 mg by mouth 2 (two) times daily.  . [DISCONTINUED] methylphenidate 54 MG PO CR tablet Take 1 tablet (54 mg total) by mouth daily. Take 1 tab po daily  . [START ON 08/09/2018] Methylphenidate HCl ER 54 MG TB24 Take 1 tablet by mouth daily.  Derrill Memo ON 09/08/2018] Methylphenidate HCl ER 54 MG TB24 Take 1 tablet by mouth daily.   No facility-administered encounter medications on file as of 07/10/2018.     Surgical History: History reviewed. No pertinent surgical history.  Medical History: Past Medical History:  Diagnosis Date  . ADD (attention deficit disorder)   . GERD (gastroesophageal reflux disease)     Family History: Family History  Problem Relation Age of Onset  . Diabetes Father   . Cancer Paternal Grandmother     Social History   Socioeconomic History  . Marital status: Single    Spouse name: Not on file  . Number of children: Not on file  . Years of education: Not on file  . Highest education level: Not on file  Occupational History   . Not on file  Social Needs  . Financial resource strain: Not on file  . Food insecurity:    Worry: Not on file    Inability: Not on file  . Transportation needs:    Medical: Not on file    Non-medical: Not on file  Tobacco Use  . Smoking status: Never Smoker  . Smokeless tobacco: Never Used  Substance and Sexual Activity  . Alcohol use: No    Frequency: Never  . Drug use: No  . Sexual activity: Not on file  Lifestyle  . Physical activity:    Days per week: Not on file    Minutes per session: Not on file  . Stress: Not on file  Relationships  . Social connections:    Talks on phone: Not on file    Gets together: Not on file    Attends religious service: Not on file    Active member of club or organization: Not on file    Attends meetings of clubs or organizations: Not on file    Relationship status: Not on file  . Intimate partner violence:    Fear of current or ex partner: Not on file    Emotionally abused: Not on file    Physically abused: Not on file    Forced sexual activity: Not on file  Other Topics Concern  . Not on file  Social History Narrative  . Not on file      Review of Systems  Constitutional: Negative.  Negative for chills, fatigue and unexpected weight change.  HENT: Negative.  Negative for congestion, rhinorrhea, sneezing and sore throat.   Eyes: Negative for redness.  Respiratory: Negative.  Negative for cough, chest tightness and shortness of breath.   Cardiovascular: Negative.  Negative for chest pain and palpitations.  Gastrointestinal: Negative.  Negative for abdominal pain, constipation, diarrhea, nausea and vomiting.  Endocrine: Negative.   Genitourinary: Negative.  Negative for dysuria and frequency.  Musculoskeletal: Negative.  Negative for arthralgias, back pain, joint swelling and neck pain.  Skin: Negative.  Negative for rash.  Allergic/Immunologic: Negative.   Neurological: Negative.  Negative for tremors and numbness.   Hematological: Negative for adenopathy. Does not bruise/bleed easily.  Psychiatric/Behavioral: Negative.  Negative for behavioral problems, sleep disturbance and suicidal ideas. The patient is not nervous/anxious.     Vital Signs: BP 138/80   Pulse 89   Resp 16   Ht 6\' 3"  (1.905 m)   Wt 210 lb (95.3 kg)   SpO2 94%   BMI 26.25 kg/m    Physical Exam  Constitutional: He is oriented to person, place, and time. He appears well-developed and well-nourished. No distress.  HENT:  Head: Normocephalic and atraumatic.  Mouth/Throat: Oropharynx is clear and moist. No oropharyngeal exudate.  Eyes: Pupils are equal, round, and reactive to light. EOM are normal.  Neck: Normal range of motion. Neck supple. No JVD present. No tracheal deviation present. No thyromegaly present.  Cardiovascular: Normal rate, regular rhythm and normal heart sounds. Exam reveals no gallop and no friction rub.  No murmur heard. Pulmonary/Chest: Effort normal and breath sounds normal. No respiratory distress. He has no wheezes. He has no rales. He exhibits no tenderness.  Abdominal: Soft. There is no tenderness. There is no guarding.  Musculoskeletal: Normal range of motion.  Lymphadenopathy:    He has no cervical adenopathy.  Neurological: He is alert and oriented to person, place, and time. No cranial nerve deficit.  Skin: Skin is warm and dry. He is not diaphoretic.  Psychiatric: He has a normal mood and affect. His behavior is normal. Judgment and thought content normal.  Nursing note and vitals reviewed.   Assessment/Plan: 1. Attention deficit disorder (ADD) in adult Continue Concerta as directed.  - methylphenidate 54 MG PO CR tablet; Take 1 tablet (54 mg total) by mouth daily.  Dispense: 30 tablet; Refill: 0 - Methylphenidate HCl ER 54 MG TB24; Take 1 tablet by mouth daily.  Dispense: 30 tablet; Refill: 0 - Methylphenidate HCl ER 54 MG TB24; Take 1 tablet by mouth daily.  Dispense: 30 tablet; Refill:  0  2. Gastroesophageal reflux disease without esophagitis Continue Prevacid as directed.   General Counseling: Gary Needle understanding of the findings of todays visit and agrees with plan of treatment. I have discussed any further diagnostic evaluation that may be needed or ordered today. We also reviewed his medications today. he has been encouraged to call the office with any questions or concerns that should arise related to todays visit.    No orders of the defined types were placed in this encounter.   Meds ordered this encounter  Medications  . methylphenidate 54 MG PO CR tablet    Sig: Take 1 tablet (54 mg total) by mouth daily.    Dispense:  30 tablet    Refill:  0    Fill after 07/07/2018  . Methylphenidate  HCl ER 54 MG TB24    Sig: Take 1 tablet by mouth daily.    Dispense:  30 tablet    Refill:  0    Do not fill before 08/09/2018  . Methylphenidate HCl ER 54 MG TB24    Sig: Take 1 tablet by mouth daily.    Dispense:  30 tablet    Refill:  0    Do not fill before 09/08/2018    Time spent: 20 Minutes   This patient was seen by Orson Gear AGNP-C in Collaboration with Dr Lavera Guise as a part of collaborative care agreement    Orson Gear [facility] Internal medicine

## 2018-08-10 ENCOUNTER — Ambulatory Visit: Payer: Self-pay | Admitting: Adult Health

## 2018-10-09 ENCOUNTER — Ambulatory Visit: Payer: BLUE CROSS/BLUE SHIELD | Admitting: Adult Health

## 2018-10-09 ENCOUNTER — Encounter: Payer: Self-pay | Admitting: Adult Health

## 2018-10-09 VITALS — BP 134/89 | HR 99 | Resp 16 | Ht 75.0 in | Wt 225.6 lb

## 2018-10-09 DIAGNOSIS — K219 Gastro-esophageal reflux disease without esophagitis: Secondary | ICD-10-CM | POA: Diagnosis not present

## 2018-10-09 DIAGNOSIS — F988 Other specified behavioral and emotional disorders with onset usually occurring in childhood and adolescence: Secondary | ICD-10-CM | POA: Diagnosis not present

## 2018-10-09 MED ORDER — METHYLPHENIDATE HCL ER (OSM) 54 MG PO TBCR: 54.0000 mg | EXTENDED_RELEASE_TABLET | ORAL | 0 refills | Status: DC

## 2018-10-09 MED ORDER — METHYLPHENIDATE HCL ER 54 MG PO TB24: 1.0000 | ORAL_TABLET | Freq: Every day | ORAL | 0 refills | Status: DC

## 2018-10-09 NOTE — Progress Notes (Signed)
[facility] [place], [place] [address]  Internal MEDICINE  Office Visit Note  Patient Name: Gary Price  [id number]  [id number]  Date of Service: 10/10/2018  Chief Complaint  Patient presents with  . ADD  . Gastroesophageal Reflux    HPI Pt is here for follow up on ADD and GERD.  Patient reports his GERD symptoms have been well controlled.  He denies any recent issues.  He continues to take 54 mg of Concerta daily.  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  He reports that his attention and concentration is much improved when taking Concerta and would like to continue it at this time.  He denies any side effects from medication.   Current Medication: Outpatient Encounter Medications as of 10/09/2018  Medication Sig  . lansoprazole (PREVACID) 30 MG capsule Take 1 capsule (30 mg total) by mouth daily at 12 noon.  . methylphenidate 54 MG PO CR tablet Take 1 tablet (54 mg total) by mouth daily.  . Methylphenidate HCl ER 54 MG TB24 Take 1 tablet by mouth daily.  . Methylphenidate HCl ER 54 MG TB24 Take 1 tablet by mouth daily.  . [DISCONTINUED] ranitidine (ZANTAC) 75 MG tablet Take 75 mg by mouth 2 (two) times daily.  . methylphenidate 54 MG PO CR tablet Take 1 tablet (54 mg total) by mouth every morning.  Derrill Memo ON 12/08/2018] methylphenidate 54 MG PO CR tablet Take 1 tablet (54 mg total) by mouth every morning.  Derrill Memo ON 11/08/2018] Methylphenidate HCl ER 54 MG TB24 Take 1 tablet by mouth daily.   No facility-administered encounter medications on file as of 10/09/2018.     Surgical History: History reviewed. No pertinent surgical history.  Medical History: Past Medical History:  Diagnosis Date  . ADD (attention deficit disorder)   . GERD (gastroesophageal reflux disease)     Family History: Family History  Problem Relation Age of Onset  . Diabetes Father   . Cancer Paternal Grandmother     Social  History   Socioeconomic History  . Marital status: Single    Spouse name: Not on file  . Number of children: Not on file  . Years of education: Not on file  . Highest education level: Not on file  Occupational History  . Not on file  Social Needs  . Financial resource strain: Not on file  . Food insecurity:    Worry: Not on file    Inability: Not on file  . Transportation needs:    Medical: Not on file    Non-medical: Not on file  Tobacco Use  . Smoking status: Never Smoker  . Smokeless tobacco: Never Used  Substance and Sexual Activity  . Alcohol use: No    Frequency: Never  . Drug use: No  . Sexual activity: Not on file  Lifestyle  . Physical activity:    Days per week: Not on file    Minutes per session: Not on file  . Stress: Not on file  Relationships  . Social connections:    Talks on phone: Not on file    Gets together: Not on file    Attends religious service: Not on file    Active member of club or organization: Not on file    Attends meetings of clubs or organizations: Not on file    Relationship status: Not on file  . Intimate partner violence:  Fear of current or ex partner: Not on file    Emotionally abused: Not on file    Physically abused: Not on file    Forced sexual activity: Not on file  Other Topics Concern  . Not on file  Social History Narrative  . Not on file      Review of Systems  Constitutional: Negative.  Negative for chills, fatigue and unexpected weight change.  HENT: Negative.  Negative for congestion, rhinorrhea, sneezing and sore throat.   Eyes: Negative for redness.  Respiratory: Negative.  Negative for cough, chest tightness and shortness of breath.   Cardiovascular: Negative.  Negative for chest pain and palpitations.  Gastrointestinal: Negative.  Negative for abdominal pain, constipation, diarrhea, nausea and vomiting.  Endocrine: Negative.   Genitourinary: Negative.  Negative for dysuria and frequency.   Musculoskeletal: Negative.  Negative for arthralgias, back pain, joint swelling and neck pain.  Skin: Negative.  Negative for rash.  Allergic/Immunologic: Negative.   Neurological: Negative.  Negative for tremors and numbness.  Hematological: Negative for adenopathy. Does not bruise/bleed easily.  Psychiatric/Behavioral: Negative.  Negative for behavioral problems, sleep disturbance and suicidal ideas. The patient is not nervous/anxious.     Vital Signs: BP 134/89   Pulse 99   Resp 16   Ht 6\' 3"  (1.905 m)   Wt 225 lb 9.6 oz (102.3 kg)   SpO2 96%   BMI 28.20 kg/m    Physical Exam Vitals signs and nursing note reviewed.  Constitutional:      General: He is not in acute distress.    Appearance: He is well-developed. He is not diaphoretic.  HENT:     Head: Normocephalic and atraumatic.     Mouth/Throat:     Pharynx: No oropharyngeal exudate.  Eyes:     Pupils: Pupils are equal, round, and reactive to light.  Neck:     Musculoskeletal: Normal range of motion and neck supple.     Thyroid: No thyromegaly.     Vascular: No JVD.     Trachea: No tracheal deviation.  Cardiovascular:     Rate and Rhythm: Normal rate and regular rhythm.     Heart sounds: Normal heart sounds. No murmur. No friction rub. No gallop.   Pulmonary:     Effort: Pulmonary effort is normal. No respiratory distress.     Breath sounds: Normal breath sounds. No wheezing or rales.  Chest:     Chest wall: No tenderness.  Abdominal:     Palpations: Abdomen is soft.     Tenderness: There is no abdominal tenderness. There is no guarding.  Musculoskeletal: Normal range of motion.  Lymphadenopathy:     Cervical: No cervical adenopathy.  Skin:    General: Skin is warm and dry.  Neurological:     Mental Status: He is alert and oriented to person, place, and time.     Cranial Nerves: No cranial nerve deficit.  Psychiatric:        Behavior: Behavior normal.        Thought Content: Thought content normal.         Judgment: Judgment normal.    Assessment/Plan: 1. Attention deficit disorder (ADD) in adult 3 Concerta prescription sent to pharmacy for patient. - methylphenidate 54 MG PO CR tablet; Take 1 tablet (54 mg total) by mouth every morning.  Dispense: 30 tablet; Refill: 0 Refilled Controlled medications today. Reviewed risks and possible side effects associated with taking Stimulants. Combination of these drugs with other psychotropic medications could  cause dizziness and drowsiness. Pt needs to Monitor symptoms and exercise caution in driving and operating heavy machinery to avoid damages to oneself, to others and to the surroundings. Patient verbalized understanding in this matter. Dependence and abuse for these drugs will be monitored closely. A Controlled substance policy and procedure is on file which allows [facility] to order a urine drug screen test at any visit. Patient understands and agrees with the plan..  2. Gastroesophageal reflux disease without esophagitis Patient will continue take medications as prescribed.  General Counseling: Casimiro Needle understanding of the findings of todays visit and agrees with plan of treatment. I have discussed any further diagnostic evaluation that may be needed or ordered today. We also reviewed his medications today. he has been encouraged to call the office with any questions or concerns that should arise related to todays visit.    No orders of the defined types were placed in this encounter.   Meds ordered this encounter  Medications  . methylphenidate 54 MG PO CR tablet    Sig: Take 1 tablet (54 mg total) by mouth every morning.    Dispense:  30 tablet    Refill:  0  . Methylphenidate HCl ER 54 MG TB24    Sig: Take 1 tablet by mouth daily.    Dispense:  30 tablet    Refill:  0    Do not fill before 11/08/2018  . methylphenidate 54 MG PO CR tablet    Sig: Take 1 tablet (54 mg total) by mouth every morning.    Dispense:  30  tablet    Refill:  0    Do not fill before 12/08/2018    Time spent: 25 Minutes   This patient was seen by Orson Gear AGNP-C in Collaboration with Dr Lavera Guise as a part of collaborative care agreement     Kendell Bane AGNP-C Internal medicine

## 2018-10-09 NOTE — Patient Instructions (Signed)

## 2018-10-15 DIAGNOSIS — J029 Acute pharyngitis, unspecified: Secondary | ICD-10-CM | POA: Diagnosis not present

## 2018-10-15 DIAGNOSIS — J019 Acute sinusitis, unspecified: Secondary | ICD-10-CM | POA: Diagnosis not present

## 2018-10-15 DIAGNOSIS — R6889 Other general symptoms and signs: Secondary | ICD-10-CM | POA: Diagnosis not present

## 2018-10-15 DIAGNOSIS — J101 Influenza due to other identified influenza virus with other respiratory manifestations: Secondary | ICD-10-CM | POA: Diagnosis not present

## 2018-11-27 ENCOUNTER — Encounter: Payer: Self-pay | Admitting: Adult Health

## 2018-12-08 ENCOUNTER — Telehealth: Payer: Self-pay

## 2018-12-08 ENCOUNTER — Other Ambulatory Visit: Payer: Self-pay | Admitting: Adult Health

## 2018-12-08 DIAGNOSIS — F988 Other specified behavioral and emotional disorders with onset usually occurring in childhood and adolescence: Secondary | ICD-10-CM

## 2018-12-08 MED ORDER — METHYLPHENIDATE HCL ER (OSM) 54 MG PO TBCR: 54.0000 mg | EXTENDED_RELEASE_TABLET | ORAL | 0 refills | Status: DC

## 2018-12-08 NOTE — Telephone Encounter (Signed)
Error

## 2018-12-08 NOTE — Telephone Encounter (Signed)
Pt advised we send med  

## 2018-12-11 ENCOUNTER — Encounter: Payer: Self-pay | Admitting: Adult Health

## 2018-12-11 ENCOUNTER — Ambulatory Visit (INDEPENDENT_AMBULATORY_CARE_PROVIDER_SITE_OTHER): Payer: 59 | Admitting: Adult Health

## 2018-12-11 VITALS — BP 142/98 | HR 98 | Resp 16 | Ht 75.0 in | Wt 229.0 lb

## 2018-12-11 DIAGNOSIS — K219 Gastro-esophageal reflux disease without esophagitis: Secondary | ICD-10-CM

## 2018-12-11 DIAGNOSIS — F988 Other specified behavioral and emotional disorders with onset usually occurring in childhood and adolescence: Secondary | ICD-10-CM | POA: Diagnosis not present

## 2018-12-11 MED ORDER — METHYLPHENIDATE HCL ER (OSM) 54 MG PO TBCR: 54.0000 mg | EXTENDED_RELEASE_TABLET | ORAL | 0 refills | Status: DC

## 2018-12-11 MED ORDER — METHYLPHENIDATE HCL ER 54 MG PO TB24: 1.0000 | ORAL_TABLET | Freq: Every day | ORAL | 0 refills | Status: DC

## 2018-12-11 NOTE — Progress Notes (Signed)
[facility] [place], [place] [address]  Internal MEDICINE  Office Visit Note  Patient Name: Gary Price  [id number]  [id number]  Date of Service: 12/11/2018  Chief Complaint  Patient presents with  . ADD    medication is expensive     HPI  Pt is here for follow up on ADD.  Patient reports he has continued excellent relief of symptoms when taking his Concerta.  Patient has been on Concerta for more than 20 years.  His insurance recently changed and his co-pay is now $100 a month for his Concerta.  We discussed at length switching his medication to another medicine however with him being in school for the next 8 weeks with his new job, he did not find it pertinent to change his medication since we would have to start with a low-dose and move up.  He completes these 8 weeks of training will discuss switching his medication to a more affordable option.   Current Medication: Outpatient Encounter Medications as of 12/11/2018  Medication Sig  . lansoprazole (PREVACID) 30 MG capsule Take 1 capsule (30 mg total) by mouth daily at 12 noon.  . methylphenidate 54 MG PO CR tablet Take 1 tablet (54 mg total) by mouth every morning.  Derrill Memo ON 01/10/2019] Methylphenidate HCl ER 54 MG TB24 Take 1 tablet by mouth daily.  . [DISCONTINUED] methylphenidate 54 MG PO CR tablet Take 1 tablet (54 mg total) by mouth daily.  . [DISCONTINUED] methylphenidate 54 MG PO CR tablet Take 1 tablet (54 mg total) by mouth every morning.  . [DISCONTINUED] methylphenidate 54 MG PO CR tablet Take 1 tablet (54 mg total) by mouth every morning.  . [DISCONTINUED] Methylphenidate HCl ER 54 MG TB24 Take 1 tablet by mouth daily.  . [DISCONTINUED] Methylphenidate HCl ER 54 MG TB24 Take 1 tablet by mouth daily. (Patient not taking: Reported on 12/11/2018)  . [DISCONTINUED] Methylphenidate HCl ER 54 MG TB24 Take 1 tablet by mouth daily. (Patient not taking: Reported on 12/11/2018)   No facility-administered  encounter medications on file as of 12/11/2018.     Surgical History: History reviewed. No pertinent surgical history.  Medical History: Past Medical History:  Diagnosis Date  . ADD (attention deficit disorder)   . GERD (gastroesophageal reflux disease)     Family History: Family History  Problem Relation Age of Onset  . Diabetes Father   . Cancer Paternal Grandmother     Social History   Socioeconomic History  . Marital status: Single    Spouse name: Not on file  . Number of children: Not on file  . Years of education: Not on file  . Highest education level: Not on file  Occupational History  . Not on file  Social Needs  . Financial resource strain: Not on file  . Food insecurity:    Worry: Not on file    Inability: Not on file  . Transportation needs:    Medical: Not on file    Non-medical: Not on file  Tobacco Use  . Smoking status: Never Smoker  . Smokeless tobacco: Never Used  Substance and Sexual Activity  . Alcohol use: No    Frequency: Never  . Drug use: No  . Sexual activity: Not on file  Lifestyle  . Physical activity:    Days per week: Not on file    Minutes per session: Not on file  . Stress: Not on file  Relationships  . Social connections:  Talks on phone: Not on file    Gets together: Not on file    Attends religious service: Not on file    Active member of club or organization: Not on file    Attends meetings of clubs or organizations: Not on file    Relationship status: Not on file  . Intimate partner violence:    Fear of current or ex partner: Not on file    Emotionally abused: Not on file    Physically abused: Not on file    Forced sexual activity: Not on file  Other Topics Concern  . Not on file  Social History Narrative  . Not on file      Review of Systems  Constitutional: Negative.  Negative for chills, fatigue and unexpected weight change.  HENT: Negative.  Negative for congestion, rhinorrhea, sneezing and sore throat.    Eyes: Negative for redness.  Respiratory: Negative.  Negative for cough, chest tightness and shortness of breath.   Cardiovascular: Negative.  Negative for chest pain and palpitations.  Gastrointestinal: Negative.  Negative for abdominal pain, constipation, diarrhea, nausea and vomiting.  Endocrine: Negative.   Genitourinary: Negative.  Negative for dysuria and frequency.  Musculoskeletal: Negative.  Negative for arthralgias, back pain, joint swelling and neck pain.  Skin: Negative.  Negative for rash.  Allergic/Immunologic: Negative.   Neurological: Negative.  Negative for tremors and numbness.  Hematological: Negative for adenopathy. Does not bruise/bleed easily.  Psychiatric/Behavioral: Negative.  Negative for behavioral problems, sleep disturbance and suicidal ideas. The patient is not nervous/anxious.     Vital Signs: BP (!) 142/98   Pulse 98   Resp 16   Ht 6\' 3"  (1.905 m)   Wt 229 lb (103.9 kg)   SpO2 96%   BMI 28.62 kg/m    Physical Exam Vitals signs and nursing note reviewed.  Constitutional:      General: He is not in acute distress.    Appearance: He is well-developed. He is not diaphoretic.  HENT:     Head: Normocephalic and atraumatic.     Mouth/Throat:     Pharynx: No oropharyngeal exudate.  Eyes:     Pupils: Pupils are equal, round, and reactive to light.  Neck:     Musculoskeletal: Normal range of motion and neck supple.     Thyroid: No thyromegaly.     Vascular: No JVD.     Trachea: No tracheal deviation.  Cardiovascular:     Rate and Rhythm: Normal rate and regular rhythm.     Heart sounds: Normal heart sounds. No murmur. No friction rub. No gallop.   Pulmonary:     Effort: Pulmonary effort is normal. No respiratory distress.     Breath sounds: Normal breath sounds. No wheezing or rales.  Chest:     Chest wall: No tenderness.  Abdominal:     Palpations: Abdomen is soft.     Tenderness: There is no abdominal tenderness. There is no guarding.   Musculoskeletal: Normal range of motion.  Lymphadenopathy:     Cervical: No cervical adenopathy.  Skin:    General: Skin is warm and dry.  Neurological:     Mental Status: He is alert and oriented to person, place, and time.     Cranial Nerves: No cranial nerve deficit.  Psychiatric:        Behavior: Behavior normal.        Thought Content: Thought content normal.        Judgment: Judgment normal.  Assessment/Plan: 1. Attention deficit disorder (ADD) in adult Refilled patient's Concerta.  Being filled for 2 months point we will follow-up in 2 months to assess switching his Concerta to another medication that is possibly more horrible on his current insurance. - methylphenidate 54 MG PO CR tablet; Take 1 tablet (54 mg total) by mouth every morning.  Dispense: 30 tablet; Refill: 0 - Methylphenidate HCl ER 54 MG TB24; Take 1 tablet by mouth daily.  Dispense: 30 tablet; Refill: 0  2. Gastroesophageal reflux disease without esophagitis Stable, continue current medication as prescribed.  General Counseling: Casimiro Needle understanding of the findings of todays visit and agrees with plan of treatment. I have discussed any further diagnostic evaluation that may be needed or ordered today. We also reviewed his medications today. he has been encouraged to call the office with any questions or concerns that should arise related to todays visit.    No orders of the defined types were placed in this encounter.   Meds ordered this encounter  Medications  . methylphenidate 54 MG PO CR tablet    Sig: Take 1 tablet (54 mg total) by mouth every morning.    Dispense:  30 tablet    Refill:  0  . Methylphenidate HCl ER 54 MG TB24    Sig: Take 1 tablet by mouth daily.    Dispense:  30 tablet    Refill:  0    Do not fill before 01/10/2019    Time spent: 25 Minutes   This patient was seen by Orson Gear AGNP-C in Collaboration with Dr Lavera Guise as a part of collaborative care  agreement     Kendell Bane AGNP-C Internal medicine

## 2018-12-11 NOTE — Patient Instructions (Signed)
Amphetamine; Dextroamphetamine tablets  What is this medicine?  AMPHETAMINE; DEXTROAMPHETAMINE(am FET a meen; dex troe am FET a meen) is used to treat attention-deficit hyperactivity disorder (ADHD). It may also be used for narcolepsy. Federal law prohibits giving this medicine to any person other than the person for whom it was prescribed. Do not share this medicine with anyone else.  This medicine may be used for other purposes; ask your health care provider or pharmacist if you have questions.  COMMON BRAND NAME(S): Adderall  What should I tell my health care provider before I take this medicine?  They need to know if you have any of these conditions:  -anxiety or panic attacks  -circulation problems in fingers and toes  -glaucoma  -hardening or blockages of the arteries or heart blood vessels  -heart disease or a heart defect  -high blood pressure  -history of a drug or alcohol abuse problem  -history of stroke  -kidney disease  -liver disease  -mental illness  -seizures  -suicidal thoughts, plans, or attempt; a previous suicide attempt by you or a family member  -thyroid disease  -Tourette's syndrome  -an unusual or allergic reaction to dextroamphetamine, other amphetamines, other medicines, foods, dyes, or preservatives  -pregnant or trying to get pregnant  -breast-feeding  How should I use this medicine?  Take this medicine by mouth with a glass of water. Follow the directions on the prescription label. Take your doses at regular intervals. Do not take your medicine more often than directed. Do not suddenly stop your medicine. You must gradually reduce the dose or you may feel withdrawal effects. Ask your doctor or health care professional for advice.  Talk to your pediatrician regarding the use of this medicine in children. Special care may be needed. While this drug may be prescribed for children as young as 3 years for selected conditions, precautions do apply.  Overdosage: If you think you have taken too  much of this medicine contact a poison control center or emergency room at once.  NOTE: This medicine is only for you. Do not share this medicine with others.  What if I miss a dose?  If you miss a dose, take it as soon as you can. If it is almost time for your next dose, take only that dose. Do not take double or extra doses.  What may interact with this medicine?  Do not take this medicine with any of the following medications:  -MAOIs like Carbex, Eldepryl, Marplan, Nardil, and Parnate  -other stimulant medicines for attention disorders  This medicine may also interact with the following medications:  -acetazolamide  -ammonium chloride  -antacids  -ascorbic acid  -atomoxetine  -caffeine  -certain medicines for blood pressure  -certain medicines for depression, anxiety, or psychotic disturbances  -certain medicines for seizures like carbamazepine, phenobarbital, phenytoin  -certain medicines for stomach problems like cimetidine, ranitidine, famotidine, esomeprazole, omeprazole, lansoprazole, pantoprazole  -lithium  -medicines for colds and breathing difficulties  -medicines for diabetes  -medicines or dietary supplements for weight loss or to stay awake  -methenamine  -narcotic medicines for pain  -quinidine  -ritonavir  -sodium bicarbonate  -St. John's wort  This list may not describe all possible interactions. Give your health care provider a list of all the medicines, herbs, non-prescription drugs, or dietary supplements you use. Also tell them if you smoke, drink alcohol, or use illegal drugs. Some items may interact with your medicine.  What should I watch for while using this medicine?    Visit your doctor or health care professional for regular checks on your progress. This prescription requires that you follow special procedures with your doctor and pharmacy. You will need to have a new written prescription from your doctor every time you need a refill.  This medicine may affect your concentration, or hide  signs of tiredness. Until you know how this medicine affects you, do not drive, ride a bicycle, use machinery, or do anything that needs mental alertness.  Tell your doctor or health care professional if this medicine loses its effects, or if you feel you need to take more than the prescribed amount. Do not change the dosage without talking to your doctor or health care professional.  Decreased appetite is a common side effect when starting this medicine. Eating small, frequent meals or snacks can help. Talk to your doctor if you continue to have poor eating habits. Height and weight growth of a child taking this medicine will be monitored closely.  Do not take this medicine close to bedtime. It may prevent you from sleeping.  If you are going to need surgery, a MRI, CT scan, or other procedure, tell your doctor that you are taking this medicine. You may need to stop taking this medicine before the procedure.  Tell your doctor or healthcare professional right away if you notice unexplained wounds on your fingers and toes while taking this medicine. You should also tell your healthcare provider if you experience numbness or pain, changes in the skin color, or sensitivity to temperature in your fingers or toes.  What side effects may I notice from receiving this medicine?  Side effects that you should report to your doctor or health care professional as soon as possible:  -allergic reactions like skin rash, itching or hives, swelling of the face, lips, or tongue  -anxious  -breathing problems  -changes in emotions or moods  -changes in vision  -chest pain or chest tightness  -fast, irregular heartbeat  -fingers or toes feel numb, cool, painful  -hallucination, loss of contact with reality  -high blood pressure  -males: prolonged or painful erection  -seizures  -signs and symptoms of serotonin syndrome like confusion, increased sweating, fever, tremor, stiff muscles, diarrhea  -signs and symptoms of a stroke like  changes in vision; confusion; trouble speaking or understanding; severe headaches; sudden numbness or weakness of the face, arm or leg; trouble walking; dizziness; loss of balance or coordination  -suicidal thoughts or other mood changes  -uncontrollable head, mouth, neck, arm, or leg movements  Side effects that usually do not require medical attention (report to your doctor or health care professional if they continue or are bothersome):  -dry mouth  -headache  -irritability  -loss of appetite  -nausea  -trouble sleeping  -weight loss  This list may not describe all possible side effects. Call your doctor for medical advice about side effects. You may report side effects to FDA at 1-800-FDA-1088.  Where should I keep my medicine?  Keep out of the reach of children. This medicine can be abused. Keep your medicine in a safe place to protect it from theft. Do not share this medicine with anyone. Selling or giving away this medicine is dangerous and against the law.  Store at room temperature between 15 and 30 degrees C (59 and 86 degrees F). Keep container tightly closed. Throw away any unused medicine after the expiration date. Dispose of properly. This medicine may cause accidental overdose and death if it is taken by   other adults, children, or pets. Mix any unused medicine with a substance like cat litter or coffee grounds. Then throw the medicine away in a sealed container like a sealed bag or a coffee can with a lid. Do not use the medicine after the expiration date.  NOTE: This sheet is a summary. It may not cover all possible information. If you have questions about this medicine, talk to your doctor, pharmacist, or health care provider.   2019 Elsevier/Gold Standard (2016-11-15 16:28:23)

## 2019-01-08 ENCOUNTER — Ambulatory Visit: Payer: Self-pay | Admitting: Adult Health

## 2019-02-11 ENCOUNTER — Ambulatory Visit: Payer: 59 | Admitting: Nurse Practitioner

## 2019-02-11 ENCOUNTER — Other Ambulatory Visit: Payer: Self-pay

## 2019-02-11 ENCOUNTER — Encounter: Payer: Self-pay | Admitting: Nurse Practitioner

## 2019-02-11 VITALS — Ht 75.0 in | Wt 225.0 lb

## 2019-02-11 DIAGNOSIS — K219 Gastro-esophageal reflux disease without esophagitis: Secondary | ICD-10-CM

## 2019-02-11 DIAGNOSIS — F988 Other specified behavioral and emotional disorders with onset usually occurring in childhood and adolescence: Secondary | ICD-10-CM | POA: Diagnosis not present

## 2019-02-11 MED ORDER — METHYLPHENIDATE HCL ER (OSM) 54 MG PO TBCR: 54.0000 mg | EXTENDED_RELEASE_TABLET | ORAL | 0 refills | Status: DC

## 2019-02-11 NOTE — Progress Notes (Signed)
[facility] [place], [place] [address]  Internal MEDICINE  Telephone Visit  Patient Name: Gary Price  [id number]  [id number]  Date of Service: 03/01/2019  I connected with the patient at 4:30pm by webcam and verified the patients identity using two identifiers.   I discussed the limitations, risks, security and privacy concerns of performing an evaluation and management service by webcam and the availability of in person appointments. I also discussed with the patient that there may be a patient responsible charge related to the service.  The patient expressed understanding and agrees to proceed.    Chief Complaint  Patient presents with  . Telephone Screen    VIDEO VISIT  . Telephone Assessment  . Medical Management of Chronic Issues    2 month follow up  . Gastroesophageal Reflux  . ADD    The patient has been contacted via webcam for follow up visit due to concerns for spread of novel coronavirus. The patient is currently taking Concerta XR 54mg  daily. This medication helps to keep him focused and on track while at work. 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 Taking concerta helps him to concentrate and do his job in safe manner. He does need to have refills today.        Current Medication: Outpatient Encounter Medications as of 02/11/2019  Medication Sig  . lansoprazole (PREVACID) 30 MG capsule Take 1 capsule (30 mg total) by mouth daily at 12 noon.  . methylphenidate 54 MG PO CR tablet Take 1 tablet (54 mg total) by mouth every morning.  . [DISCONTINUED] methylphenidate 54 MG PO CR tablet Take 1 tablet (54 mg total) by mouth every morning.  . [DISCONTINUED] methylphenidate 54 MG PO CR tablet Take 1 tablet (54 mg total) by mouth every morning.  . [DISCONTINUED] methylphenidate 54 MG PO CR tablet Take 1 tablet (54 mg total) by mouth every morning.  .  [DISCONTINUED] Methylphenidate HCl ER 54 MG TB24 Take 1 tablet by mouth daily.   No facility-administered encounter medications on file as of 02/11/2019.     Surgical History: History reviewed. No pertinent surgical history.  Medical History: Past Medical History:  Diagnosis Date  . ADD (attention deficit disorder)   . GERD (gastroesophageal reflux disease)     Family History: Family History  Problem Relation Age of Onset  . Diabetes Father   . Cancer Paternal Grandmother     Social History   Socioeconomic History  . Marital status: Single    Spouse name: Not on file  . Number of children: Not on file  . Years of education: Not on file  . Highest education level: Not on file  Occupational History  . Not on file  Social Needs  . Financial resource strain: Not on file  . Food insecurity:    Worry: Not on file    Inability: Not on file  . Transportation needs:    Medical: Not on file    Non-medical: Not on file  Tobacco Use  . Smoking status: Never Smoker  . Smokeless tobacco: Never Used  Substance and Sexual Activity  . Alcohol use: No    Frequency: Never  . Drug use: No  . Sexual activity: Not on file  Lifestyle  . Physical activity:    Days per week: Not on file    Minutes per session: Not  on file  . Stress: Not on file  Relationships  . Social connections:    Talks on phone: Not on file    Gets together: Not on file    Attends religious service: Not on file    Active member of club or organization: Not on file    Attends meetings of clubs or organizations: Not on file    Relationship status: Not on file  . Intimate partner violence:    Fear of current or ex partner: Not on file    Emotionally abused: Not on file    Physically abused: Not on file    Forced sexual activity: Not on file  Other Topics Concern  . Not on file  Social History Narrative  . Not on file      Review of Systems  Constitutional: Negative for activity change, chills,  fatigue and unexpected weight change.  HENT: Negative for congestion, postnasal drip, rhinorrhea, sneezing and sore throat.   Respiratory: Negative for cough, chest tightness, shortness of breath and wheezing.   Cardiovascular: Negative for chest pain and palpitations.  Gastrointestinal: Negative for abdominal pain, constipation, diarrhea, nausea and vomiting.       Intermittent acid reflux.   Endocrine: Negative for cold intolerance, heat intolerance, polydipsia, polyphagia and polyuria.  Musculoskeletal: Negative for arthralgias, back pain, joint swelling, myalgias and neck pain.  Skin: Negative for rash.  Allergic/Immunologic: Negative for environmental allergies.  Neurological: Negative for dizziness, tremors, numbness and headaches.  Hematological: Negative for adenopathy. Does not bruise/bleed easily.  Psychiatric/Behavioral: Positive for decreased concentration. Negative for behavioral problems (Depression), sleep disturbance and suicidal ideas. The patient is not nervous/anxious.     Today's Vitals   02/11/19 1614  Weight: 225 lb (102.1 kg)  Height: 6\' 3"  (1.905 m)   Body mass index is 28.12 kg/m.  Observation/Objective:  The patient is alert and oriented. He is pleasant and answering all questions appropriately. Breathing is non-labored. He is in no acute distress.     Assessment/Plan: 1. Gastroesophageal reflux disease without esophagitis Continue prevacid as needed and as prescribed   2. Attention deficit disorder (ADD) in adult May continue concerta 54mg  po qd. Three 30 day prescriptions were sent to his pharmacy. Dates are 02/11/2019, 03/12/2019, and 04/09/2019 - methylphenidate 54 MG PO CR tablet; Take 1 tablet (54 mg total) by mouth every morning.  Dispense: 30 tablet; Refill: 0  General Counseling: Ason verbalizes understanding of the findings of today's phone visit and agrees with plan of treatment. I have discussed any further diagnostic evaluation that may be  needed or ordered today. We also reviewed his medications today. he has been encouraged to call the office with any questions or concerns that should arise related to todays visit.  Refilled Controlled medications today. Reviewed risks and possible side effects associated with taking Stimulants. Combination of these drugs with other psychotropic medications could cause dizziness and drowsiness. Pt needs to Monitor symptoms and exercise caution in driving and operating heavy machinery to avoid damages to oneself, to others and to the surroundings. Patient verbalized understanding in this matter. Dependence and abuse for these drugs will be monitored closely. A Controlled substance policy and procedure is on file which allows [facility] to order a urine drug screen test at any visit. Patient understands and agrees with the plan..  Meds ordered this encounter  Medications  . DISCONTD: methylphenidate 54 MG PO CR tablet    Sig: Take 1 tablet (54 mg total) by mouth every morning.  Dispense:  30 tablet    Refill:  0    Order Specific Question:   Supervising Provider    Answer:   Lavera Guise [[id number]]  . DISCONTD: methylphenidate 54 MG PO CR tablet    Sig: Take 1 tablet (54 mg total) by mouth every morning.    Dispense:  30 tablet    Refill:  0    Fill after 03/12/2019    Order Specific Question:   Supervising Provider    Answer:   Lavera Guise [[id number]]  . methylphenidate 54 MG PO CR tablet    Sig: Take 1 tablet (54 mg total) by mouth every morning.    Dispense:  30 tablet    Refill:  0    Fill after 04/08/2019    Order Specific Question:   Supervising Provider    Answer:   Lavera Guise [[id number]]    Time spent: 69 Minutes    Dr Lavera Guise Internal medicine

## 2019-05-14 ENCOUNTER — Other Ambulatory Visit: Payer: Self-pay

## 2019-05-14 ENCOUNTER — Ambulatory Visit: Payer: 59

## 2019-05-14 ENCOUNTER — Ambulatory Visit (INDEPENDENT_AMBULATORY_CARE_PROVIDER_SITE_OTHER): Payer: 59 | Admitting: Podiatry

## 2019-05-14 ENCOUNTER — Encounter: Payer: Self-pay | Admitting: Podiatry

## 2019-05-14 VITALS — Temp 96.1°F

## 2019-05-14 DIAGNOSIS — M722 Plantar fascial fibromatosis: Secondary | ICD-10-CM

## 2019-05-14 MED ORDER — DICLOFENAC SODIUM 75 MG PO TBEC: 75.0000 mg | DELAYED_RELEASE_TABLET | Freq: Two times a day (BID) | ORAL | 2 refills | Status: DC

## 2019-05-14 MED ORDER — METHYLPREDNISOLONE 4 MG PO TBPK: ORAL_TABLET | ORAL | 0 refills | Status: DC

## 2019-05-16 NOTE — Progress Notes (Signed)
   Subjective: 28 y.o. male presenting today as a new patient with a chief complaint of bilateral heel pain that began about one year ago. He states it feels as if he is walking on a bruise. The pain is worse in the morning and by the end of the day after being on the feet for long periods of time. Patient states the right foot hurts worse than the left. He has been taking Tylenol and stretching the feet which help alleviate the pain. Patient is here for further evaluation and treatment.   Past Medical History:  Diagnosis Date  . ADD (attention deficit disorder)   . GERD (gastroesophageal reflux disease)      Objective: Physical Exam General: The patient is alert and oriented x3 in no acute distress.  Dermatology: Skin is warm, dry and supple bilateral lower extremities. Negative for open lesions or macerations bilateral.   Vascular: Dorsalis Pedis and Posterior Tibial pulses palpable bilateral.  Capillary fill time is immediate to all digits.  Neurological: Epicritic and protective threshold intact bilateral.   Musculoskeletal: Tenderness to palpation to the plantar aspect of the bilateral heels along the plantar fascia. All other joints range of motion within normal limits bilateral. Strength 5/5 in all groups bilateral.   Radiographic exam: Normal osseous mineralization. Joint spaces preserved. No fracture/dislocation/boney destruction. No other soft tissue abnormalities or radiopaque foreign bodies.   Assessment: 1. plantar fasciitis bilateral feet, right greater than left  Plan of Care:  1. Patient evaluated. Xrays reviewed.   2. Injection of 0.5cc Celestone soluspan injected into the right heel.  3. Rx for Medrol Dose Pak placed 4. Rx for Diclofenac ordered for patient. 5. Instructed patient regarding therapies and modalities at home to alleviate symptoms.  6. Return to clinic as needed.  Goes by [employer]. 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     Edrick Kins, DPM [facility]   Dr. Edrick Kins, DPM    [address], [place] [address]                Office [phone]  Fax [phone]

## 2019-05-18 ENCOUNTER — Encounter: Payer: Self-pay | Admitting: Adult Health

## 2019-05-18 ENCOUNTER — Ambulatory Visit: Payer: 59 | Admitting: Adult Health

## 2019-05-18 ENCOUNTER — Other Ambulatory Visit: Payer: Self-pay

## 2019-05-18 DIAGNOSIS — F988 Other specified behavioral and emotional disorders with onset usually occurring in childhood and adolescence: Secondary | ICD-10-CM | POA: Diagnosis not present

## 2019-05-18 DIAGNOSIS — K219 Gastro-esophageal reflux disease without esophagitis: Secondary | ICD-10-CM | POA: Diagnosis not present

## 2019-05-18 MED ORDER — METHYLPHENIDATE HCL ER (OSM) 54 MG PO TBCR: 54.0000 mg | EXTENDED_RELEASE_TABLET | ORAL | 0 refills | Status: DC

## 2019-05-18 NOTE — Progress Notes (Signed)
[facility] [place], [place] [address]  Internal MEDICINE  Telephone Visit  Patient Name: Gary Price  [id number]  [id number]  Date of Service: 05/18/2019  I connected with the patient at 454 by telephone and verified the patients identity using two identifiers.   I discussed the limitations, risks, security and privacy concerns of performing an evaluation and management service by telephone and the availability of in person appointments. I also discussed with the patient that there may be a patient responsible charge related to the service.  The patient expressed understanding and agrees to proceed.    Chief Complaint  Patient presents with  . Telephone Screen  . Medical Management of Chronic Issues  . ADD  . Telephone Assessment    HPI  Pt is seen via telephone for follow up on ADD.  He is doing very well at this time. His current dose of concerta manages his symptoms well.  HE denies any side effects to medications.     Current Medication: Outpatient Encounter Medications as of 05/18/2019  Medication Sig  . diclofenac (VOLTAREN) 75 MG EC tablet Take 1 tablet (75 mg total) by mouth 2 (two) times daily.  . lansoprazole (PREVACID) 30 MG capsule Take 1 capsule (30 mg total) by mouth daily at 12 noon.  . methylphenidate 54 MG PO CR tablet Take 1 tablet (54 mg total) by mouth every morning.  . [DISCONTINUED] methylPREDNISolone (MEDROL DOSEPAK) 4 MG TBPK tablet 6 day dose pack - take as directed (Patient not taking: Reported on 05/18/2019)   No facility-administered encounter medications on file as of 05/18/2019.     Surgical History: History reviewed. No pertinent surgical history.  Medical History: Past Medical History:  Diagnosis Date  . ADD (attention deficit disorder)   . GERD (gastroesophageal reflux disease)     Family History: Family History  Problem Relation Age of Onset  . Diabetes Father   . Cancer Paternal Grandmother     Social  History   Socioeconomic History  . Marital status: Single    Spouse name: Not on file  . Number of children: Not on file  . Years of education: Not on file  . Highest education level: Not on file  Occupational History  . Not on file  Social Needs  . Financial resource strain: Not on file  . Food insecurity    Worry: Not on file    Inability: Not on file  . Transportation needs    Medical: Not on file    Non-medical: Not on file  Tobacco Use  . Smoking status: Never Smoker  . Smokeless tobacco: Never Used  Substance and Sexual Activity  . Alcohol use: No    Frequency: Never  . Drug use: No  . Sexual activity: Not on file  Lifestyle  . Physical activity    Days per week: Not on file    Minutes per session: Not on file  . Stress: Not on file  Relationships  . Social Herbalist on phone: Not on file    Gets together: Not on file    Attends religious service: Not on file    Active member of club or organization: Not on file    Attends meetings of clubs or organizations: Not on file    Relationship status: Not on file  . Intimate partner violence    Fear of current or ex partner: Not on file    Emotionally abused: Not on file  Physically abused: Not on file    Forced sexual activity: Not on file  Other Topics Concern  . Not on file  Social History Narrative  . Not on file      Review of Systems  Constitutional: Negative.  Negative for chills, fatigue and unexpected weight change.  HENT: Negative.  Negative for congestion, rhinorrhea, sneezing and sore throat.   Eyes: Negative for redness.  Respiratory: Negative.  Negative for cough, chest tightness and shortness of breath.   Cardiovascular: Negative.  Negative for chest pain and palpitations.  Gastrointestinal: Negative.  Negative for abdominal pain, constipation, diarrhea, nausea and vomiting.  Endocrine: Negative.   Genitourinary: Negative.  Negative for dysuria and frequency.  Musculoskeletal:  Negative.  Negative for arthralgias, back pain, joint swelling and neck pain.  Skin: Negative.  Negative for rash.  Allergic/Immunologic: Negative.   Neurological: Negative.  Negative for tremors and numbness.  Hematological: Negative for adenopathy. Does not bruise/bleed easily.  Psychiatric/Behavioral: Negative.  Negative for behavioral problems, sleep disturbance and suicidal ideas. The patient is not nervous/anxious.     Vital Signs: There were no vitals taken for this visit.   Observation/Objective: Well appearing NAD noted at this time   Assessment/Plan: 1. Attention deficit disorder (ADD) in adult Refilled Controlled medications today. Reviewed risks and possible side effects associated with taking Stimulants. Combination of these drugs with other psychotropic medications could cause dizziness and drowsiness. Pt needs to Monitor symptoms and exercise caution in driving and operating heavy machinery to avoid damages to oneself, to others and to the surroundings. Patient verbalized understanding in this matter. Dependence and abuse for these drugs will be monitored closely. A Controlled substance policy and procedure is on file which allows [facility] to order a urine drug screen test at any visit. Patient understands and agrees with the plan.. - methylphenidate 54 MG PO CR tablet; Take 1 tablet (54 mg total) by mouth every morning.  Dispense: 30 tablet; Refill: 0 - methylphenidate 54 MG PO CR tablet; Take 1 tablet (54 mg total) by mouth every morning.  Dispense: 30 tablet; Refill: 0  2. GERD without esophagitis Stable, continue current medications.   General Counseling: glennis montenegro understanding of the findings of today's phone visit and agrees with plan of treatment. I have discussed any further diagnostic evaluation that may be needed or ordered today. We also reviewed his medications today. he has been encouraged to call the office with any questions or concerns  that should arise related to todays visit.    No orders of the defined types were placed in this encounter.   No orders of the defined types were placed in this encounter.   Time spent: 11 Minutes    This patient was seen by Orson Gear AGNP-C in Collaboration with Dr Lavera Guise as a part of collaborative care agreement Internal medicine

## 2019-05-21 ENCOUNTER — Ambulatory Visit: Payer: 59 | Admitting: Nurse Practitioner

## 2019-06-02 ENCOUNTER — Other Ambulatory Visit: Payer: 59 | Admitting: Orthotics

## 2019-06-09 ENCOUNTER — Other Ambulatory Visit: Payer: Self-pay

## 2019-06-09 ENCOUNTER — Ambulatory Visit (INDEPENDENT_AMBULATORY_CARE_PROVIDER_SITE_OTHER): Payer: 59 | Admitting: Orthotics

## 2019-06-09 DIAGNOSIS — M722 Plantar fascial fibromatosis: Secondary | ICD-10-CM

## 2019-06-09 NOTE — Progress Notes (Signed)

## 2019-07-07 ENCOUNTER — Other Ambulatory Visit: Payer: Self-pay

## 2019-07-07 ENCOUNTER — Ambulatory Visit: Payer: 59 | Admitting: Orthotics

## 2019-07-07 DIAGNOSIS — M722 Plantar fascial fibromatosis: Secondary | ICD-10-CM

## 2019-07-07 NOTE — Progress Notes (Signed)
Patient came in today to pick up custom made foot orthotics.  The goals were accomplished and the patient reported no dissatisfaction with said orthotics.  Patient was advised of breakin period and how to report any issues. 

## 2019-07-13 ENCOUNTER — Ambulatory Visit: Payer: 59 | Admitting: Nurse Practitioner

## 2019-07-14 ENCOUNTER — Other Ambulatory Visit: Payer: 59 | Admitting: Orthotics

## 2019-07-14 ENCOUNTER — Other Ambulatory Visit: Payer: Self-pay

## 2019-07-14 DIAGNOSIS — F988 Other specified behavioral and emotional disorders with onset usually occurring in childhood and adolescence: Secondary | ICD-10-CM

## 2019-07-14 MED ORDER — METHYLPHENIDATE HCL ER (OSM) 54 MG PO TBCR: 54.0000 mg | EXTENDED_RELEASE_TABLET | ORAL | 0 refills | Status: DC

## 2019-07-16 ENCOUNTER — Ambulatory Visit: Payer: 59 | Admitting: Nurse Practitioner

## 2019-07-26 ENCOUNTER — Ambulatory Visit: Payer: 59 | Admitting: Nurse Practitioner

## 2019-07-30 ENCOUNTER — Encounter: Payer: Self-pay | Admitting: Nurse Practitioner

## 2019-07-30 ENCOUNTER — Other Ambulatory Visit: Payer: Self-pay

## 2019-07-30 ENCOUNTER — Ambulatory Visit: Payer: 59 | Admitting: Nurse Practitioner

## 2019-07-30 VITALS — BP 128/92 | HR 94 | Temp 99.0°F | Resp 16 | Ht 75.0 in | Wt 237.0 lb

## 2019-07-30 DIAGNOSIS — F988 Other specified behavioral and emotional disorders with onset usually occurring in childhood and adolescence: Secondary | ICD-10-CM

## 2019-07-30 DIAGNOSIS — K219 Gastro-esophageal reflux disease without esophagitis: Secondary | ICD-10-CM | POA: Diagnosis not present

## 2019-07-30 MED ORDER — METHYLPHENIDATE HCL ER (OSM) 54 MG PO TBCR: 54.0000 mg | EXTENDED_RELEASE_TABLET | ORAL | 0 refills | Status: DC

## 2019-07-30 NOTE — Progress Notes (Signed)
[facility] [place], [place] [address]  Internal MEDICINE  Office Visit Note  Patient Name: Gary Price  [id number]  [id number]  Date of Service: 08/18/2019  Chief Complaint  Patient presents with  . ADD  . Gastroesophageal Reflux    The patient is currently taking Concerta XR 54mg  daily. This medication helps to keep him focused and on track while at work. 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 Taking concerta helps him to concentrate and do his job in safe manner. He does need o have refills today.   Medication Refill Pertinent negatives include no abdominal pain, arthralgias, chest pain, chills, congestion, coughing, fatigue, headaches, joint swelling, myalgias, nausea, neck pain, numbness, rash, sore throat or vomiting.       Current Medication: Outpatient Encounter Medications as of 07/30/2019  Medication Sig  . diclofenac (VOLTAREN) 75 MG EC tablet Take 1 tablet (75 mg total) by mouth 2 (two) times daily.  . lansoprazole (PREVACID) 30 MG capsule Take 1 capsule (30 mg total) by mouth daily at 12 noon.  . methylphenidate 54 MG PO CR tablet Take 1 tablet (54 mg total) by mouth every morning.  . [DISCONTINUED] methylphenidate 54 MG PO CR tablet Take 1 tablet (54 mg total) by mouth every morning.  . [DISCONTINUED] methylphenidate 54 MG PO CR tablet Take 1 tablet (54 mg total) by mouth every morning.  . [DISCONTINUED] methylphenidate 54 MG PO CR tablet Take 1 tablet (54 mg total) by mouth every morning.  . [DISCONTINUED] methylphenidate 54 MG PO CR tablet Take 1 tablet (54 mg total) by mouth every morning.   No facility-administered encounter medications on file as of 07/30/2019.     Surgical History: History reviewed. No pertinent surgical history.  Medical History: Past Medical History:  Diagnosis Date  . ADD (attention deficit disorder)   . GERD  (gastroesophageal reflux disease)     Family History: Family History  Problem Relation Age of Onset  . Diabetes Father   . Cancer Paternal Grandmother     Social History   Socioeconomic History  . Marital status: Single    Spouse name: Not on file  . Number of children: Not on file  . Years of education: Not on file  . Highest education level: Not on file  Occupational History  . Not on file  Social Needs  . Financial resource strain: Not on file  . Food insecurity    Worry: Not on file    Inability: Not on file  . Transportation needs    Medical: Not on file    Non-medical: Not on file  Tobacco Use  . Smoking status: Never Smoker  . Smokeless tobacco: Never Used  Substance and Sexual Activity  . Alcohol use: No    Frequency: Never  . Drug use: No  . Sexual activity: Not on file  Lifestyle  . Physical activity    Days per week: Not on file    Minutes per session: Not on file  . Stress: Not on file  Relationships  . Social Herbalist on phone: Not on file    Gets together: Not on file    Attends religious service: Not on file    Active member of club or organization: Not on file    Attends meetings of clubs or organizations: Not on file  Relationship status: Not on file  . Intimate partner violence    Fear of current or ex partner: Not on file    Emotionally abused: Not on file    Physically abused: Not on file    Forced sexual activity: Not on file  Other Topics Concern  . Not on file  Social History Narrative  . Not on file      Review of Systems  Constitutional: Negative for activity change, chills, fatigue and unexpected weight change.  HENT: Negative for congestion, postnasal drip, rhinorrhea, sneezing and sore throat.   Respiratory: Negative for cough, chest tightness, shortness of breath and wheezing.   Cardiovascular: Negative for chest pain and palpitations.  Gastrointestinal: Negative for abdominal pain, constipation, diarrhea,  nausea and vomiting.       Intermittent acid reflux.   Endocrine: Negative for cold intolerance, heat intolerance, polydipsia, polyphagia and polyuria.  Musculoskeletal: Negative for arthralgias, back pain, joint swelling, myalgias and neck pain.  Skin: Negative for rash.  Allergic/Immunologic: Negative for environmental allergies.  Neurological: Negative for dizziness, tremors, numbness and headaches.  Hematological: Negative for adenopathy. Does not bruise/bleed easily.  Psychiatric/Behavioral: Positive for decreased concentration. Negative for behavioral problems (Depression), sleep disturbance and suicidal ideas. The patient is not nervous/anxious.    Today's Vitals   07/30/19 1542  BP: (!) 128/92  Pulse: 94  Resp: 16  Temp: 99 F (37.2 C)  SpO2: 97%  Weight: 237 lb (107.5 kg)  Height: 6\' 3"  (1.905 m)   Body mass index is 29.62 kg/m.  Physical Exam Vitals signs and nursing note reviewed.  Constitutional:      General: He is not in acute distress.    Appearance: Normal appearance. He is well-developed. He is not diaphoretic.  HENT:     Head: Normocephalic and atraumatic.     Mouth/Throat:     Pharynx: No oropharyngeal exudate.  Eyes:     Pupils: Pupils are equal, round, and reactive to light.  Neck:     Musculoskeletal: Normal range of motion and neck supple.     Thyroid: No thyromegaly.     Vascular: No JVD.     Trachea: No tracheal deviation.  Cardiovascular:     Rate and Rhythm: Normal rate and regular rhythm.     Heart sounds: Normal heart sounds. No murmur. No friction rub. No gallop.   Pulmonary:     Effort: Pulmonary effort is normal. No respiratory distress.     Breath sounds: Normal breath sounds. No wheezing or rales.  Chest:     Chest wall: No tenderness.  Abdominal:     General: Bowel sounds are normal.     Palpations: Abdomen is soft.     Tenderness: There is no abdominal tenderness.  Musculoskeletal: Normal range of motion.  Lymphadenopathy:      Cervical: No cervical adenopathy.  Skin:    General: Skin is warm and dry.  Neurological:     Mental Status: He is alert and oriented to person, place, and time.     Cranial Nerves: No cranial nerve deficit.  Psychiatric:        Behavior: Behavior normal.        Thought Content: Thought content normal.        Judgment: Judgment normal.    Assessment/Plan: 1. Gastroesophageal reflux disease without esophagitis Well managed. Continue using prilosec as needed   2. Attention deficit disorder (ADD) in adult May continue concerta CR 54mg  daily when needed. Three 30 day prescriptions were  provided. Dates are 07/30/2019, 08/28/2019, and 09/25/2019. - methylphenidate 54 MG PO CR tablet; Take 1 tablet (54 mg total) by mouth every morning.  Dispense: 30 tablet; Refill: 0  General Counseling: Sedric verbalizes understanding of the findings of todays visit and agrees with plan of treatment. I have discussed any further diagnostic evaluation that may be needed or ordered today. We also reviewed his medications today. he has been encouraged to call the office with any questions or concerns that should arise related to todays visit.   This patient was seen by [place] with Dr Lavera Guise as a part of collaborative care agreement  Meds ordered this encounter  Medications  . DISCONTD: methylphenidate 54 MG PO CR tablet    Sig: Take 1 tablet (54 mg total) by mouth every morning.    Dispense:  30 tablet    Refill:  0    Order Specific Question:   Supervising Provider    Answer:   Lavera Guise [id number]  . DISCONTD: methylphenidate 54 MG PO CR tablet    Sig: Take 1 tablet (54 mg total) by mouth every morning.    Dispense:  30 tablet    Refill:  0    Fill after 08/28/2019    Order Specific Question:   Supervising Provider    Answer:   Lavera Guise [id number]  . methylphenidate 54 MG PO CR tablet    Sig: Take 1 tablet (54 mg total) by mouth every morning.    Dispense:  30  tablet    Refill:  0    Fill after 1219/2020    Order Specific Question:   Supervising Provider    Answer:   Lavera Guise [id number]    Time spent: 44 Minutes      Dr Lavera Guise Internal medicine

## 2019-11-15 ENCOUNTER — Other Ambulatory Visit: Payer: Self-pay | Admitting: Nurse Practitioner

## 2019-11-15 ENCOUNTER — Telehealth: Payer: Self-pay

## 2019-11-15 DIAGNOSIS — F988 Other specified behavioral and emotional disorders with onset usually occurring in childhood and adolescence: Secondary | ICD-10-CM

## 2019-11-15 MED ORDER — METHYLPHENIDATE HCL ER (OSM) 54 MG PO TBCR: 54.0000 mg | EXTENDED_RELEASE_TABLET | ORAL | 0 refills | Status: DC

## 2019-11-15 NOTE — Progress Notes (Signed)
Single prescription for concerta sent to [facility]

## 2019-11-15 NOTE — Telephone Encounter (Signed)
SEND MESSAGE TO [facility]

## 2019-11-15 NOTE — Telephone Encounter (Signed)
CONFIRMED AT THE TIME APPT WAS MADE FOR 11-18-19 VIRTUAL VISIT.

## 2019-11-15 NOTE — Telephone Encounter (Signed)
Single prescription for concerta sent to [facility]

## 2019-11-15 NOTE — Telephone Encounter (Signed)
Pt advised we send pres keep appt

## 2019-11-18 ENCOUNTER — Ambulatory Visit: Payer: 59 | Admitting: Nurse Practitioner

## 2019-11-18 ENCOUNTER — Encounter: Payer: Self-pay | Admitting: Nurse Practitioner

## 2019-11-18 VITALS — Ht 75.0 in | Wt 235.0 lb

## 2019-11-18 DIAGNOSIS — F988 Other specified behavioral and emotional disorders with onset usually occurring in childhood and adolescence: Secondary | ICD-10-CM | POA: Diagnosis not present

## 2019-11-18 DIAGNOSIS — K219 Gastro-esophageal reflux disease without esophagitis: Secondary | ICD-10-CM | POA: Diagnosis not present

## 2019-11-18 MED ORDER — METHYLPHENIDATE HCL ER (OSM) 54 MG PO TBCR: 54.0000 mg | EXTENDED_RELEASE_TABLET | ORAL | 0 refills | Status: DC

## 2019-11-18 NOTE — Progress Notes (Signed)
[facility] [place], [place] [address]  Internal MEDICINE  Telephone Visit  Patient Name: Gary Price  [id number]  [id number]  Date of Service: 11/21/2019  I connected with the patient at 4:56pm by webcam and verified the patients identity using two identifiers.   I discussed the limitations, risks, security and privacy concerns of performing an evaluation and management service by webcam and the availability of in person appointments. I also discussed with the patient that there may be a patient responsible charge related to the service.  The patient expressed understanding and agrees to proceed.    Chief Complaint  Patient presents with  . Telephone Assessment  . Telephone Screen  . ADD    The patient has been contacted via webcam for follow up visit due to concerns for spread of novel coronavirus. The patient presents for routine visit. The patient is currently taking Concerta XR 54mg  daily. This medication helps to keep him focused and on track while at work. 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 Taking concerta helps him to concentrate and do his job in safe manner. He does need o have refills today.        Current Medication: Outpatient Encounter Medications as of 11/18/2019  Medication Sig  . diclofenac (VOLTAREN) 75 MG EC tablet Take 1 tablet (75 mg total) by mouth 2 (two) times daily.  . lansoprazole (PREVACID) 30 MG capsule Take 1 capsule (30 mg total) by mouth daily at 12 noon.  . methylphenidate 54 MG PO CR tablet Take 1 tablet (54 mg total) by mouth every morning.  . [DISCONTINUED] methylphenidate 54 MG PO CR tablet Take 1 tablet (54 mg total) by mouth every morning.  . [DISCONTINUED] methylphenidate 54 MG PO CR tablet Take 1 tablet (54 mg total) by mouth every morning.  . [DISCONTINUED] methylphenidate 54 MG PO CR tablet Take 1 tablet (54 mg total)  by mouth every morning.   No facility-administered encounter medications on file as of 11/18/2019.    Surgical History: History reviewed. No pertinent surgical history.  Medical History: Past Medical History:  Diagnosis Date  . ADD (attention deficit disorder)   . GERD (gastroesophageal reflux disease)     Family History: Family History  Problem Relation Age of Onset  . Diabetes Father   . Cancer Paternal Grandmother     Social History   Socioeconomic History  . Marital status: Single    Spouse name: Not on file  . Number of children: Not on file  . Years of education: Not on file  . Highest education level: Not on file  Occupational History  . Not on file  Tobacco Use  . Smoking status: Never Smoker  . Smokeless tobacco: Never Used  Substance and Sexual Activity  . Alcohol use: No  . Drug use: No  . Sexual activity: Not on file  Other Topics Concern  . Not on file  Social History Narrative  . Not on file   Social Determinants of Health   Financial Resource Strain:   . Difficulty of Paying Living Expenses: Not on file  Food Insecurity:   . Worried About Charity fundraiser in the Last Year: Not on file  . Ran Out of Food in the Last Year: Not on file  Transportation Needs:   . Lack of Transportation (Medical): Not on file  .  Lack of Transportation (Non-Medical): Not on file  Physical Activity:   . Days of Exercise per Week: Not on file  . Minutes of Exercise per Session: Not on file  Stress:   . Feeling of Stress : Not on file  Social Connections:   . Frequency of Communication with Friends and Family: Not on file  . Frequency of Social Gatherings with Friends and Family: Not on file  . Attends Religious Services: Not on file  . Active Member of Clubs or Organizations: Not on file  . Attends Archivist Meetings: Not on file  . Marital Status: Not on file  Intimate Partner Violence:   . Fear of Current or Ex-Partner: Not on file  .  Emotionally Abused: Not on file  . Physically Abused: Not on file  . Sexually Abused: Not on file      Review of Systems  Constitutional: Negative for activity change, chills, fatigue and unexpected weight change.  HENT: Negative for congestion, postnasal drip, rhinorrhea, sneezing and sore throat.   Respiratory: Negative for cough, chest tightness, shortness of breath and wheezing.   Cardiovascular: Negative for chest pain and palpitations.  Gastrointestinal: Negative for abdominal pain, constipation, diarrhea, nausea and vomiting.       Intermittent acid reflux.   Endocrine: Negative for cold intolerance, heat intolerance, polydipsia and polyuria.  Musculoskeletal: Negative for arthralgias, back pain, joint swelling, myalgias and neck pain.  Skin: Negative for rash.  Allergic/Immunologic: Negative for environmental allergies.  Neurological: Negative for dizziness, tremors, numbness and headaches.  Hematological: Negative for adenopathy. Does not bruise/bleed easily.  Psychiatric/Behavioral: Positive for decreased concentration. Negative for behavioral problems (Depression), sleep disturbance and suicidal ideas. The patient is not nervous/anxious.     Today's Vitals   11/18/19 1602  Weight: 235 lb (106.6 kg)  Height: 6\' 3"  (1.905 m)   Body mass index is 29.37 kg/m.  Observation/Objective:  The patient is alert and oriented. He is pleasant and answering all questions appropriately. Breathing is non-labored. He is in no acute distress.    Assessment/Plan: 1. Gastroesophageal reflux disease without esophagitis Continue prevacid as prescribed   2. Attention deficit disorder (ADD) in adult May continue to take concerta CR 54mg  daily. Three 30 day prescriptions were sent to his pharmacy. Dates are 12/13/2019, 01/11/2020, and 02/08/2020 - methylphenidate 54 MG PO CR tablet; Take 1 tablet (54 mg total) by mouth every morning.  Dispense: 30 tablet; Refill: 0  General Counseling: Gary Price  verbalizes understanding of the findings of today's phone visit and agrees with plan of treatment. I have discussed any further diagnostic evaluation that may be needed or ordered today. We also reviewed his medications today. he has been encouraged to call the office with any questions or concerns that should arise related to todays visit.   Refilled Controlled medications today. Reviewed risks and possible side effects associated with taking Stimulants. Combination of these drugs with other psychotropic medications could cause dizziness and drowsiness. Pt needs to Monitor symptoms and exercise caution in driving and operating heavy machinery to avoid damages to oneself, to others and to the surroundings. Patient verbalized understanding in this matter. Dependence and abuse for these drugs will be monitored closely. A Controlled substance policy and procedure is on file which allows [facility] to order a urine drug screen test at any visit. Patient understands and agrees with the plan..  This patient was seen by Leretha Pol FNP [facility] with Dr Lavera Guise as a part of collaborative  care agreement  Meds ordered this encounter  Medications  . DISCONTD: methylphenidate 54 MG PO CR tablet    Sig: Take 1 tablet (54 mg total) by mouth every morning.    Dispense:  30 tablet    Refill:  0    Fill after 12/13/2019    Order Specific Question:   Supervising Provider    Answer:   Lavera Guise [id number]  . DISCONTD: methylphenidate 54 MG PO CR tablet    Sig: Take 1 tablet (54 mg total) by mouth every morning.    Dispense:  30 tablet    Refill:  0    Fill after 01/11/2020    Order Specific Question:   Supervising Provider    Answer:   Lavera Guise [id number]  . methylphenidate 54 MG PO CR tablet    Sig: Take 1 tablet (54 mg total) by mouth every morning.    Dispense:  30 tablet    Refill:  0    Fill after 02/08/2020    Order Specific Question:   Supervising Provider    Answer:   Lavera Guise [id number]    Time spent: 53 Minutes    Dr Lavera Guise Internal medicine

## 2019-12-03 ENCOUNTER — Ambulatory Visit: Payer: 59 | Admitting: Nurse Practitioner

## 2020-03-14 ENCOUNTER — Ambulatory Visit: Payer: 59 | Admitting: Nurse Practitioner

## 2020-03-21 ENCOUNTER — Other Ambulatory Visit: Payer: Self-pay | Admitting: Nurse Practitioner

## 2020-03-21 DIAGNOSIS — F988 Other specified behavioral and emotional disorders with onset usually occurring in childhood and adolescence: Secondary | ICD-10-CM

## 2020-03-21 MED ORDER — METHYLPHENIDATE HCL ER (OSM) 54 MG PO TBCR: 54.0000 mg | EXTENDED_RELEASE_TABLET | ORAL | 0 refills | Status: DC

## 2020-03-21 NOTE — Progress Notes (Signed)
Renewed prescription for concerta and sent to his pharmacy.

## 2020-03-28 ENCOUNTER — Telehealth: Payer: Self-pay

## 2020-03-28 NOTE — Telephone Encounter (Signed)
Confirmed and screened for 03-30-20 ov. 

## 2020-03-30 ENCOUNTER — Encounter: Payer: Self-pay | Admitting: Nurse Practitioner

## 2020-03-30 ENCOUNTER — Other Ambulatory Visit: Payer: Self-pay

## 2020-03-30 ENCOUNTER — Ambulatory Visit: Payer: 59 | Admitting: Nurse Practitioner

## 2020-03-30 VITALS — BP 147/99 | HR 98 | Temp 97.2°F | Resp 16 | Ht 75.0 in | Wt 236.2 lb

## 2020-03-30 DIAGNOSIS — K219 Gastro-esophageal reflux disease without esophagitis: Secondary | ICD-10-CM

## 2020-03-30 DIAGNOSIS — F988 Other specified behavioral and emotional disorders with onset usually occurring in childhood and adolescence: Secondary | ICD-10-CM | POA: Diagnosis not present

## 2020-03-30 DIAGNOSIS — R030 Elevated blood-pressure reading, without diagnosis of hypertension: Secondary | ICD-10-CM

## 2020-03-30 MED ORDER — METHYLPHENIDATE HCL ER (OSM) 54 MG PO TBCR: 54.0000 mg | EXTENDED_RELEASE_TABLET | ORAL | 0 refills | Status: DC

## 2020-03-30 NOTE — Progress Notes (Signed)
[facility] [place], [place] [address]  Internal MEDICINE  Office Visit Note  Patient Name: Gary Price  [id number]  [id number]  Date of Service: 04/09/2020  Chief Complaint  Patient presents with  . Follow-up    Would like to have blood work done, has family history of diabetes  . Gastroesophageal Reflux  . ADD    The patient presents for routine visit. The patient is currently taking Concerta XR 54mg  daily. This medication helps to keep him focused and on track while at work. 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 Taking concerta helps him to concentrate and do his job in safe manner. He does need o have refills today.        Current Medication: Outpatient Encounter Medications as of 03/30/2020  Medication Sig  . methylphenidate 54 MG PO CR tablet Take 1 tablet (54 mg total) by mouth every morning.  . [DISCONTINUED] diclofenac (VOLTAREN) 75 MG EC tablet Take 1 tablet (75 mg total) by mouth 2 (two) times daily.  . [DISCONTINUED] lansoprazole (PREVACID) 30 MG capsule Take 1 capsule (30 mg total) by mouth daily at 12 noon.  . [DISCONTINUED] methylphenidate 54 MG PO CR tablet Take 1 tablet (54 mg total) by mouth every morning.  . [DISCONTINUED] methylphenidate 54 MG PO CR tablet Take 1 tablet (54 mg total) by mouth every morning.  . [DISCONTINUED] methylphenidate 54 MG PO CR tablet Take 1 tablet (54 mg total) by mouth every morning.   No facility-administered encounter medications on file as of 03/30/2020.    Surgical History: History reviewed. No pertinent surgical history.  Medical History: Past Medical History:  Diagnosis Date  . ADD (attention deficit disorder)   . GERD (gastroesophageal reflux disease)     Family History: Family History  Problem Relation Age of Onset  . Diabetes Father   . Cancer Paternal Grandmother     Social History    Socioeconomic History  . Marital status: Single    Spouse name: Not on file  . Number of children: Not on file  . Years of education: Not on file  . Highest education level: Not on file  Occupational History  . Not on file  Tobacco Use  . Smoking status: Never Smoker  . Smokeless tobacco: Never Used  Vaping Use  . Vaping Use: Never used  Substance and Sexual Activity  . Alcohol use: No  . Drug use: No  . Sexual activity: Not on file  Other Topics Concern  . Not on file  Social History Narrative  . Not on file   Social Determinants of Health   Financial Resource Strain:   . Difficulty of Paying Living Expenses:   Food Insecurity:   . Worried About Charity fundraiser in the Last Year:   . Arboriculturist in the Last Year:   Transportation Needs:   . Film/video editor (Medical):   Marland Kitchen Lack of Transportation (Non-Medical):   Physical Activity:   . Days of Exercise per Week:   . Minutes of Exercise per Session:   Stress:   . Feeling of Stress :   Social Connections:   . Frequency of Communication with Friends and Family:   . Frequency of Social Gatherings with Friends and Family:   . Attends Religious Services:   . Active Member of Clubs or Organizations:   .  Attends Archivist Meetings:   Marland Kitchen Marital Status:   Intimate Partner Violence:   . Fear of Current or Ex-Partner:   . Emotionally Abused:   Marland Kitchen Physically Abused:   . Sexually Abused:       Review of Systems  Constitutional: Negative for activity change, chills, fatigue and unexpected weight change.  HENT: Negative for congestion, postnasal drip, rhinorrhea, sneezing and sore throat.   Respiratory: Negative for cough, chest tightness, shortness of breath and wheezing.   Cardiovascular: Negative for chest pain and palpitations.  Gastrointestinal: Negative for abdominal pain, constipation, diarrhea, nausea and vomiting.       Intermittent acid reflux.   Endocrine: Negative for cold intolerance,  heat intolerance, polydipsia and polyuria.  Musculoskeletal: Negative for arthralgias, back pain, joint swelling, myalgias and neck pain.  Skin: Negative for rash.  Allergic/Immunologic: Negative for environmental allergies.  Neurological: Negative for dizziness, tremors, numbness and headaches.  Hematological: Negative for adenopathy. Does not bruise/bleed easily.  Psychiatric/Behavioral: Positive for decreased concentration. Negative for behavioral problems (Depression), sleep disturbance and suicidal ideas. The patient is not nervous/anxious.     Today's Vitals   03/30/20 1546  BP: (!) 147/99  Pulse: 98  Resp: 16  Temp: (!) 97.2 F (36.2 C)  SpO2: 96%  Weight: 236 lb 3.2 oz (107.1 kg)  Height: 6\' 3"  (1.905 m)   Body mass index is 29.52 kg/m.  Physical Exam Vitals and nursing note reviewed.  Constitutional:      General: He is not in acute distress.    Appearance: Normal appearance. He is well-developed. He is not diaphoretic.  HENT:     Head: Normocephalic and atraumatic.     Mouth/Throat:     Pharynx: No oropharyngeal exudate.  Eyes:     Pupils: Pupils are equal, round, and reactive to light.  Neck:     Thyroid: No thyromegaly.     Vascular: No JVD.     Trachea: No tracheal deviation.  Cardiovascular:     Rate and Rhythm: Normal rate and regular rhythm.     Heart sounds: Normal heart sounds. No murmur heard.  No friction rub. No gallop.   Pulmonary:     Effort: Pulmonary effort is normal. No respiratory distress.     Breath sounds: Normal breath sounds. No wheezing or rales.  Chest:     Chest wall: No tenderness.  Abdominal:     Palpations: Abdomen is soft.  Musculoskeletal:        General: Normal range of motion.     Cervical back: Normal range of motion and neck supple.  Lymphadenopathy:     Cervical: No cervical adenopathy.  Skin:    General: Skin is warm and dry.  Neurological:     Mental Status: He is alert and oriented to person, place, and time.      Cranial Nerves: No cranial nerve deficit.  Psychiatric:        Mood and Affect: Mood normal.        Behavior: Behavior normal.        Thought Content: Thought content normal.        Judgment: Judgment normal.    Assessment/Plan:  1. Elevated blood pressure reading without diagnosis of hypertension Continue to monitor closely.   2. Gastroesophageal reflux disease without esophagitis Continue to take prevacid as needed and as prescribed   3. Attention deficit disorder (ADD) in adult May continue to take concerta 54mg  daily. Three 30 day prescriptions provided.  Dates are  03/30/2020, 04/27/2020, and 05/26/2020 - methylphenidate 54 MG PO CR tablet; Take 1 tablet (54 mg total) by mouth every morning.  Dispense: 30 tablet; Refill: 0  General Counseling: Dovber verbalizes understanding of the findings of todays visit and agrees with plan of treatment. I have discussed any further diagnostic evaluation that may be needed or ordered today. We also reviewed his medications today. he has been encouraged to call the office with any questions or concerns that should arise related to todays visit.   Refilled Controlled medications today. Reviewed risks and possible side effects associated with taking Stimulants. Combination of these drugs with other psychotropic medications could cause dizziness and drowsiness. Pt needs to Monitor symptoms and exercise caution in driving and operating heavy machinery to avoid damages to oneself, to others and to the surroundings. Patient verbalized understanding in this matter. Dependence and abuse for these drugs will be monitored closely. A Controlled substance policy and procedure is on file which allows [facility] to order a urine drug screen test at any visit. Patient understands and agrees with the plan..  This patient was seen by Leretha Pol FNP Collaboration with Dr Lavera Guise as a part of collaborative care agreement  Meds ordered this encounter   Medications  . DISCONTD: methylphenidate 54 MG PO CR tablet    Sig: Take 1 tablet (54 mg total) by mouth every morning.    Dispense:  30 tablet    Refill:  0    Order Specific Question:   Supervising Provider    Answer:   Lavera Guise [[id number]]  . DISCONTD: methylphenidate 54 MG PO CR tablet    Sig: Take 1 tablet (54 mg total) by mouth every morning.    Dispense:  30 tablet    Refill:  0    Fill after 04/27/2020    Order Specific Question:   Supervising Provider    Answer:   Lavera Guise [[id number]]  . methylphenidate 54 MG PO CR tablet    Sig: Take 1 tablet (54 mg total) by mouth every morning.    Dispense:  30 tablet    Refill:  0    Fill after 05/26/2020    Order Specific Question:   Supervising Provider    Answer:   Lavera Guise [[id number]]    Total time spent: 20 Minutes   Time spent includes review of chart, medications, test results, and follow up plan with the patient.      Dr Lavera Guise Internal medicine

## 2020-04-09 DIAGNOSIS — R030 Elevated blood-pressure reading, without diagnosis of hypertension: Secondary | ICD-10-CM | POA: Insufficient documentation

## 2020-04-19 ENCOUNTER — Other Ambulatory Visit: Payer: Self-pay | Admitting: Nurse Practitioner

## 2020-04-20 LAB — CBC
Hematocrit: 44.9 % (ref 37.5–51.0)
Hemoglobin: 15.5 g/dL (ref 13.0–17.7)
MCH: 28.9 pg (ref 26.6–33.0)
MCHC: 34.5 g/dL (ref 31.5–35.7)
MCV: 84 fL (ref 79–97)
Platelets: 312 10*3/uL (ref 150–450)
RBC: 5.36 x10E6/uL (ref 4.14–5.80)
RDW: 12.8 % (ref 11.6–15.4)
WBC: 6.8 10*3/uL (ref 3.4–10.8)

## 2020-04-20 LAB — COMPREHENSIVE METABOLIC PANEL
ALT: 65 IU/L — ABNORMAL HIGH (ref 0–44)
AST: 23 IU/L (ref 0–40)
Albumin/Globulin Ratio: 2 (ref 1.2–2.2)
Albumin: 4.7 g/dL (ref 4.1–5.2)
Alkaline Phosphatase: 120 IU/L (ref 48–121)
BUN/Creatinine Ratio: 12 (ref 9–20)
BUN: 12 mg/dL (ref 6–20)
Bilirubin Total: 0.3 mg/dL (ref 0.0–1.2)
CO2: 25 mmol/L (ref 20–29)
Calcium: 10.1 mg/dL (ref 8.7–10.2)
Chloride: 100 mmol/L (ref 96–106)
Creatinine, Ser: 0.98 mg/dL (ref 0.76–1.27)
GFR calc Af Amer: 121 mL/min/{1.73_m2} (ref >59)
GFR calc non Af Amer: 104 mL/min/{1.73_m2} (ref >59)
Globulin, Total: 2.3 g/dL (ref 1.5–4.5)
Glucose: 99 mg/dL (ref 65–99)
Potassium: 4.5 mmol/L (ref 3.5–5.2)
Sodium: 137 mmol/L (ref 134–144)
Total Protein: 7 g/dL (ref 6.0–8.5)

## 2020-04-20 LAB — T4, FREE: Free T4: 1.2 ng/dL (ref 0.82–1.77)

## 2020-04-20 LAB — LIPID PANEL WITH LDL/HDL RATIO
Cholesterol, Total: 217 mg/dL — ABNORMAL HIGH (ref 100–199)
HDL: 37 mg/dL — ABNORMAL LOW (ref >39)
LDL Chol Calc (NIH): 156 mg/dL — ABNORMAL HIGH (ref 0–99)
LDL/HDL Ratio: 4.2 ratio — ABNORMAL HIGH (ref 0.0–3.6)
Triglycerides: 131 mg/dL (ref 0–149)
VLDL Cholesterol Cal: 24 mg/dL (ref 5–40)

## 2020-04-20 LAB — HGB A1C W/O EAG: Hgb A1c MFr Bld: 5.3 % (ref 4.8–5.6)

## 2020-04-20 LAB — TSH: TSH: 0.475 u[IU]/mL (ref 0.450–4.500)

## 2020-04-24 ENCOUNTER — Other Ambulatory Visit: Payer: Self-pay

## 2020-04-24 ENCOUNTER — Ambulatory Visit (INDEPENDENT_AMBULATORY_CARE_PROVIDER_SITE_OTHER): Payer: 59 | Admitting: Dermatology

## 2020-04-24 DIAGNOSIS — D485 Neoplasm of uncertain behavior of skin: Secondary | ICD-10-CM | POA: Diagnosis not present

## 2020-04-24 DIAGNOSIS — L720 Epidermal cyst: Secondary | ICD-10-CM

## 2020-04-24 DIAGNOSIS — D489 Neoplasm of uncertain behavior, unspecified: Secondary | ICD-10-CM

## 2020-04-24 NOTE — Patient Instructions (Addendum)
Recommend daily broad spectrum sunscreen SPF 30+ to sun-exposed areas, reapply every 2 hours as needed. Call for new or changing lesions.  Pre-Operative Instructions  You are scheduled for a surgical procedure at [facility]. We recommend you read the following instructions. If you have any questions or concerns, please call the office at [phone].  1. Shower and wash the entire body with soap and water the day of your surgery paying special attention to cleansing at and around the planned surgery site.  2. Avoid aspirin or aspirin containing products at least fourteen (14) days prior to your surgical procedure and for at least one week (7 Days) after your surgical procedure. If you take aspirin on a regular basis for heart disease or history of stroke or for any other reason, we may recommend you continue taking aspirin but please notify us if you take this on a regular basis. Aspirin can cause more bleeding to occur during surgery as well as prolonged bleeding and bruising after surgery.   3. Avoid other nonsteroidal pain medications at least one week prior to surgery and at least one week prior to your surgery. These include medications such as Ibuprofen (Motrin, Advil and Nuprin), Naprosyn, Voltaren, Relafen, etc. If medications are used for therapeutic reasons, please inform us as they can cause increased bleeding or prolonged bleeding during and bruising after surgical procedures.   4. Please advice [place] if you are taking any "blood thinner" medications such as Coumadin or Dipyridamole or Plavix or similar medications. These cause increased bleeding and prolonged bleeding during and bruising after surgical procedures. We may have to consider discontinuing these medications briefly prior to and shortly after your surgery, if safe to do so.   5. Please inform us of all medications you are currently taking. All medications that are taken regularly should be taken the day of surgery as you  always do. Nevertheless, we need to be informed of what medications you are taking prior to surgery to whether they will affect the procedure or cause any complications.   6. Please inform us of any medication allergies. Also inform us of whether you have allergies to Latex or rubber products or whether you have had any adverse reaction to Lidocaine or Epinephrine.  7. Please inform us of any prosthetic or artificial body parts such as artificial heart valve, joint replacements, etc., or similar condition that might require preoperative antibiotics.   8. We recommend avoidance of alcohol at least two weeks prior to surgery and continued avoidence for at least two weeks after surgery.   9. We recommend discontinuation of tobacco smoking at least two weeks prior to surgery and continued abstinence for at least two weeks after surgery.  10. Do not plan strenuous exercise, strenuous work or strenuous lifting for approximately four weeks after your surgery.   11. We request if you are unable to make your scheduled surgical appointment, please call us at least a week in advance or as soon as you are aware of a problem sot aht we can cancel or reschedule you.   12. You MAKE TAKE TYLENOL (acetaminophen) for pain as it is not a blood thinner.   13. PLEASE PLAN TO BE IN TOWN FOR TWO WEEKS FOLLOWING SURGERY, THIS IS IMPORTANT SO YOU CAN BE CHECKED FOR DRESSING CHANGES, SUTURE REMOVAL AND TO MONITOR FOR POSSIBLE COMPLICATIONS.   Make sure to shave at least 3 days prior  Wound Care Instructions  1. Cleanse wound gently with soap and water  once a day then pat dry with clean gauze. Apply a thing coat of Petrolatum (petroleum jelly, "Vaseline") over the wound (unless you have an allergy to this). We recommend that you use a new, sterile tube of Vaseline. Do not pick or remove scabs. Do not remove the yellow or white "healing tissue" from the base of the wound.  2. Cover the wound with fresh, clean, nonstick  gauze and secure with paper tape. You may use Band-Aids in place of gauze and tape if the would is small enough, but would recommend trimming much of the tape off as there is often too much. Sometimes Band-Aids can irritate the skin.  3. You should call the office for your biopsy report after 1 week if you have not already been contacted.  4. If you experience any problems, such as abnormal amounts of bleeding, swelling, significant bruising, significant pain, or evidence of infection, please call the office immediately.  5. FOR ADULT SURGERY PATIENTS: If you need something for pain relief you may take 1 extra strength Tylenol (acetaminophen) AND 2 Ibuprofen (200mg  each) together every 4 hours as needed for pain. (do not take these if you are allergic to them or if you have a reason you should not take them.) Typically, you may only need pain medication for 1 to 3 days.

## 2020-04-24 NOTE — Progress Notes (Signed)
   Follow-Up Visit   Subjective  Gary Price is a 29 y.o. male who presents for the following: area of concerns (right ring finger and  jaw).  Patient presents today for a few areas of concern, one on Right ring finger and on on left jaw. Area on ring finger has been present for over a year, gets abscessed on occasion and will drain pus, area on face has been present for past few years  The following portions of the chart were reviewed this encounter and updated as appropriate:  Tobacco  Allergies  Meds  Problems  Med Hx  Surg Hx  Fam Hx     Review of Systems:  No other skin or systemic complaints except as noted in HPI or Assessment and Plan.  Objective  Well appearing patient in no apparent distress; mood and affect are within normal limits.  A focused examination was performed including Right hand and left jaw. Relevant physical exam findings are noted in the Assessment and Plan.  Objective  Left Anterior Mandible: Subcutaneous nodule 0.5 cm  Objective  Right 4th Finger Tip: 0.3 cm verrucous plaque with central opening        Assessment & Plan    Epidermal inclusion cyst Left Anterior Mandible  Cyst with symptoms and/or recent change.  Discussed surgical excision to remove, including resulting scar and possible recurrence.  Patient will schedule for surgery. Pre-op information given.   Neoplasm of uncertain behavior (wart vs foreign body vs other) Right 4th Finger Tip  Epidermal / dermal shaving  Lesion diameter (cm):  0.3 Informed consent: discussed and consent obtained   Timeout: patient name, date of birth, surgical site, and procedure verified   Procedure prep:  Patient was prepped and draped in usual sterile fashion Prep type:  Isopropyl alcohol Anesthesia: the lesion was anesthetized in a standard fashion   Anesthetic:  1% lidocaine w/ epinephrine 1-100,000 buffered w/ 8.4% NaHCO3 Instrument used: flexible razor blade   Hemostasis achieved with:  pressure, aluminum chloride and electrodesiccation   Outcome: patient tolerated procedure well   Post-procedure details: sterile dressing applied and wound care instructions given   Dressing type: bandage and petrolatum   Additional details:  0.7 cm post treatment defect  Specimen 1 - Surgical pathology Differential Diagnosis: Verruca vs other Check Margins: No 0.3 cm verrucous plaque with central opening  Shave removal today  Return for First available Surgery for cyst.  I, Donzetta Kohut, CMA, am acting as scribe for Sarina Ser, MD . Documentation: I have reviewed the above documentation for accuracy and completeness, and I agree with the above.  Sarina Ser, MD

## 2020-04-25 ENCOUNTER — Encounter: Payer: Self-pay | Admitting: Dermatology

## 2020-04-26 ENCOUNTER — Telehealth: Payer: Self-pay

## 2020-04-26 NOTE — Telephone Encounter (Signed)
Patient called and informed of biopsy results, patient verbalized understanding.  

## 2020-05-02 ENCOUNTER — Telehealth: Payer: Self-pay

## 2020-05-02 NOTE — Telephone Encounter (Signed)
Confirmed and screened for 05-04-20 ov. 

## 2020-05-04 ENCOUNTER — Encounter: Payer: Self-pay | Admitting: Nurse Practitioner

## 2020-05-04 ENCOUNTER — Other Ambulatory Visit: Payer: Self-pay

## 2020-05-04 ENCOUNTER — Ambulatory Visit: Payer: 59 | Admitting: Nurse Practitioner

## 2020-05-04 VITALS — BP 153/98 | HR 83 | Temp 97.7°F | Resp 16 | Ht 75.0 in | Wt 234.2 lb

## 2020-05-04 DIAGNOSIS — E782 Mixed hyperlipidemia: Secondary | ICD-10-CM | POA: Diagnosis not present

## 2020-05-04 DIAGNOSIS — R945 Abnormal results of liver function studies: Secondary | ICD-10-CM

## 2020-05-04 DIAGNOSIS — R030 Elevated blood-pressure reading, without diagnosis of hypertension: Secondary | ICD-10-CM | POA: Diagnosis not present

## 2020-05-04 MED ORDER — ROSUVASTATIN CALCIUM 5 MG PO TABS: 5.0000 mg | ORAL_TABLET | Freq: Every day | ORAL | 3 refills | Status: DC

## 2020-05-04 NOTE — Progress Notes (Signed)
[facility] [place], [place] [address]  Internal MEDICINE  Office Visit Note  Patient Name: Gary Price  [id number]  [id number]  Date of Service: 05/28/2020  Chief Complaint  Patient presents with  . Follow-up    Wants to review labwork  . Gastroesophageal Reflux  . Quality Metric Gaps    TDAP    The patient is here for follow up regarding lab results. Recently had routine, fasting labs done. His total cholesterol was 217 with DL at 37 and HDL of 156. His AST was mildly elevated. blood pressure is mildly elevated today. His family does have strong history of CAD. The patient has no complaint of chest pain, chest pressure, or shortness of breath.       Current Medication: Outpatient Encounter Medications as of 05/04/2020  Medication Sig  . methylphenidate 54 MG PO CR tablet Take 1 tablet (54 mg total) by mouth every morning.  Marland Kitchen omeprazole (PRILOSEC OTC) 20 MG tablet Take by mouth.  . rosuvastatin (CRESTOR) 5 MG tablet Take 1 tablet (5 mg total) by mouth daily.   No facility-administered encounter medications on file as of 05/04/2020.    Surgical History: History reviewed. No pertinent surgical history.  Medical History: Past Medical History:  Diagnosis Date  . ADD (attention deficit disorder)   . GERD (gastroesophageal reflux disease)     Family History: Family History  Problem Relation Age of Onset  . Diabetes Father   . Cancer Paternal Grandmother     Social History   Socioeconomic History  . Marital status: Single    Spouse name: Not on file  . Number of children: Not on file  . Years of education: Not on file  . Highest education level: Not on file  Occupational History  . Not on file  Tobacco Use  . Smoking status: Never Smoker  . Smokeless tobacco: Never Used  Vaping Use  . Vaping Use: Never used  Substance and Sexual Activity  . Alcohol use: No  . Drug use: No  . Sexual activity: Not on file  Other Topics Concern   . Not on file  Social History Narrative  . Not on file   Social Determinants of Health   Financial Resource Strain:   . Difficulty of Paying Living Expenses: Not on file  Food Insecurity:   . Worried About Charity fundraiser in the Last Year: Not on file  . Ran Out of Food in the Last Year: Not on file  Transportation Needs:   . Lack of Transportation (Medical): Not on file  . Lack of Transportation (Non-Medical): Not on file  Physical Activity:   . Days of Exercise per Week: Not on file  . Minutes of Exercise per Session: Not on file  Stress:   . Feeling of Stress : Not on file  Social Connections:   . Frequency of Communication with Friends and Family: Not on file  . Frequency of Social Gatherings with Friends and Family: Not on file  . Attends Religious Services: Not on file  . Active Member of Clubs or Organizations: Not on file  . Attends Archivist Meetings: Not on file  . Marital Status: Not on file  Intimate Partner Violence:   . Fear of Current or Ex-Partner: Not on file  . Emotionally Abused: Not on file  . Physically Abused: Not on file  . Sexually Abused: Not on file      Review of Systems  Constitutional:  Negative for activity change, chills, fatigue and unexpected weight change.  HENT: Negative for congestion, postnasal drip, rhinorrhea, sneezing and sore throat.   Respiratory: Negative for cough, chest tightness, shortness of breath and wheezing.   Cardiovascular: Negative for chest pain and palpitations.       Mildly elevated blood pressure today.   Gastrointestinal: Negative for abdominal pain, constipation, diarrhea, nausea and vomiting.       Intermittent acid reflux.   Endocrine: Negative for cold intolerance, heat intolerance, polydipsia and polyuria.  Musculoskeletal: Negative for arthralgias, back pain, joint swelling, myalgias and neck pain.  Skin: Negative for rash.  Allergic/Immunologic: Negative for environmental allergies.   Neurological: Negative for dizziness, tremors, numbness and headaches.  Hematological: Negative for adenopathy. Does not bruise/bleed easily.  Psychiatric/Behavioral: Positive for decreased concentration. Negative for behavioral problems (Depression), sleep disturbance and suicidal ideas. The patient is not nervous/anxious.     Today's Vitals   05/04/20 1554  BP: (!) 153/98  Pulse: 83  Resp: 16  Temp: 97.7 F (36.5 C)  SpO2: 97%  Weight: (!) 234 lb 3.2 oz (106.2 kg)  Height: 6\' 3"  (1.905 m)   Body mass index is 29.27 kg/m.  Physical Exam Vitals and nursing note reviewed.  Constitutional:      General: He is not in acute distress.    Appearance: Normal appearance. He is well-developed. He is not diaphoretic.  HENT:     Head: Normocephalic and atraumatic.     Nose: Nose normal.     Mouth/Throat:     Pharynx: No oropharyngeal exudate.  Eyes:     Pupils: Pupils are equal, round, and reactive to light.  Neck:     Thyroid: No thyromegaly.     Vascular: No JVD.     Trachea: No tracheal deviation.  Cardiovascular:     Rate and Rhythm: Normal rate and regular rhythm.     Heart sounds: Normal heart sounds. No murmur heard.  No friction rub. No gallop.   Pulmonary:     Effort: Pulmonary effort is normal. No respiratory distress.     Breath sounds: Normal breath sounds. No wheezing or rales.  Chest:     Chest wall: No tenderness.  Abdominal:     Palpations: Abdomen is soft.  Musculoskeletal:        General: Normal range of motion.     Cervical back: Normal range of motion and neck supple.  Lymphadenopathy:     Cervical: No cervical adenopathy.  Skin:    General: Skin is warm and dry.  Neurological:     Mental Status: He is alert and oriented to person, place, and time.     Cranial Nerves: No cranial nerve deficit.  Psychiatric:        Mood and Affect: Mood normal.        Behavior: Behavior normal.        Thought Content: Thought content normal.        Judgment:  Judgment normal.    Assessment/Plan: 1. Mixed hyperlipidemia Reviewed recent labs with patient. Start crestor 5mg  tablets daily. Prudent diet discussed. Written information was provided.  - rosuvastatin (CRESTOR) 5 MG tablet; Take 1 tablet (5 mg total) by mouth daily.  Dispense: 30 tablet; Refill: 3  2. Abnormal liver function Retest liver functions after starting rosuvastatin.  3. Elevated blood pressure reading without diagnosis of hypertension Discussed importance of limiting sodium and increasing water intake in the diet. Prudent diet information provided. Will monitor blood pressure closely.  General Counseling: Casimiro Needle understanding of the findings of todays visit and agrees with plan of treatment. I have discussed any further diagnostic evaluation that may be needed or ordered today. We also reviewed his medications today. he has been encouraged to call the office with any questions or concerns that should arise related to todays visit.   Hypertension Counseling:   The following hypertensive lifestyle modification were recommended and discussed:  1. Limiting alcohol intake to less than 1 oz/day of ethanol:(24 oz of beer or 8 oz of wine or 2 oz of 100-proof whiskey). 2. Take baby ASA 81 mg daily. 3. Importance of regular aerobic exercise and losing weight. 4. Reduce dietary saturated fat and cholesterol intake for overall cardiovascular health. 5. Maintaining adequate dietary potassium, calcium, and magnesium intake. 6. Regular monitoring of the blood pressure. 7. Reduce sodium intake to less than 100 mmol/day (less than 2.3 gm of sodium or less than 6 gm of sodium choride)   This patient was seen by [facility] with Dr Lavera Guise as a part of collaborative care agreement  Meds ordered this encounter  Medications  . rosuvastatin (CRESTOR) 5 MG tablet    Sig: Take 1 tablet (5 mg total) by mouth daily.    Dispense:  30 tablet    Refill:  3     Order Specific Question:   Supervising Provider    Answer:   Lavera Guise [[id number]]    Total time spent: 30 Minutes   Time spent includes review of chart, medications, test results, and follow up plan with the patient.      Dr Lavera Guise Internal medicine

## 2020-05-11 ENCOUNTER — Other Ambulatory Visit: Payer: Self-pay

## 2020-05-11 ENCOUNTER — Ambulatory Visit (INDEPENDENT_AMBULATORY_CARE_PROVIDER_SITE_OTHER): Payer: 59 | Admitting: Dermatology

## 2020-05-11 DIAGNOSIS — D1809 Hemangioma of other sites: Secondary | ICD-10-CM | POA: Diagnosis not present

## 2020-05-11 DIAGNOSIS — L720 Epidermal cyst: Secondary | ICD-10-CM

## 2020-05-11 DIAGNOSIS — D1800 Hemangioma unspecified site: Secondary | ICD-10-CM

## 2020-05-11 NOTE — Progress Notes (Signed)
   Follow-Up Visit   Subjective  Gary Price is a 29 y.o. male who presents for the following: Follow-up (Recheck biopsy proven glomus tumor - area is very tender and sensitive.).  The following portions of the chart were reviewed this encounter and updated as appropriate:  Tobacco  Allergies  Meds  Problems  Med Hx  Surg Hx  Fam Hx     Review of Systems:  No other skin or systemic complaints except as noted in HPI or Assessment and Plan.  Objective  Well appearing patient in no apparent distress; mood and affect are within normal limits.  A focused examination was performed including right hand. Relevant physical exam findings are noted in the Assessment and Plan.  Objective  Left Submandibular Area: Subcutaneous nodule.    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 Discussed condition and treatment options. Recommend surgery due to persistence. Recommend evaluation with hand surgeon vs MOHs surgery.  Epidermal inclusion cyst Left Submandibular Area He is scheduled for excision with me on 07/11/2020.  Return for cyst surgery as scheduled.   I, Ashok Cordia, CMA, am acting as scribe for Sarina Ser, MD . Documentation: I have reviewed the above documentation for accuracy and completeness, and I agree with the above.  Sarina Ser, MD

## 2020-05-13 ENCOUNTER — Encounter: Payer: Self-pay | Admitting: Dermatology

## 2020-05-16 ENCOUNTER — Other Ambulatory Visit: Payer: Self-pay

## 2020-05-16 DIAGNOSIS — D1800 Hemangioma unspecified site: Secondary | ICD-10-CM

## 2020-05-28 DIAGNOSIS — R945 Abnormal results of liver function studies: Secondary | ICD-10-CM | POA: Insufficient documentation

## 2020-05-28 DIAGNOSIS — E782 Mixed hyperlipidemia: Secondary | ICD-10-CM | POA: Insufficient documentation

## 2020-06-08 ENCOUNTER — Encounter: Payer: Self-pay | Admitting: Plastic Surgery

## 2020-06-08 ENCOUNTER — Other Ambulatory Visit: Payer: Self-pay

## 2020-06-08 ENCOUNTER — Ambulatory Visit (INDEPENDENT_AMBULATORY_CARE_PROVIDER_SITE_OTHER): Payer: 59 | Admitting: Plastic Surgery

## 2020-06-08 VITALS — BP 143/87 | HR 85 | Temp 98.3°F | Ht 75.0 in | Wt 235.6 lb

## 2020-06-08 DIAGNOSIS — D1800 Hemangioma unspecified site: Secondary | ICD-10-CM | POA: Diagnosis not present

## 2020-06-08 NOTE — Progress Notes (Signed)
   Referring Provider Ronnell Freshwater, NP [address] [place],  [place] [address]   CC:  Chief Complaint  Patient presents with  . Advice Only      Gary Price is an 29 y.o. male.  HPI: Patient presents for painful lesion of his right ring finger.  Is been present for about a year and a half.  It was a firm verrucous type lesion.  Shave biopsy was done by his dermatologist Dr. Nehemiah Massed.  Pathology showed glomus tumor positive margin at the base.  Patient still feels sensitivity and firmness there and would like to pursue surgical excision.  No Known Allergies  Outpatient Encounter Medications as of 06/08/2020  Medication Sig  . methylphenidate 54 MG PO CR tablet Take 1 tablet (54 mg total) by mouth every morning.  Marland Kitchen omeprazole (PRILOSEC OTC) 20 MG tablet Take by mouth.  . rosuvastatin (CRESTOR) 5 MG tablet Take 1 tablet (5 mg total) by mouth daily.   No facility-administered encounter medications on file as of 06/08/2020.     Past Medical History:  Diagnosis Date  . ADD (attention deficit disorder)   . GERD (gastroesophageal reflux disease)     No past surgical history on file.  Family History  Problem Relation Age of Onset  . Diabetes Father   . Cancer Paternal Grandmother     Social History   Social History Narrative  . Not on file     Review of Systems General: Denies fevers, chills, weight loss CV: Denies chest pain, shortness of breath, palpitations  Physical Exam Vitals with BMI 06/08/2020 05/04/2020 03/30/2020  Height 6\' 3"  6\' 3"  6\' 3"   Weight 235 lbs 10 oz 234 lbs 3 oz 236 lbs 3 oz  BMI 29.45 53.97 67.34  Systolic 193 790 240  Diastolic 87 98 99  Pulse 85 83 98    General:  No acute distress,  Alert and oriented, Non-Toxic, Normal speech and affect Right hand: Fingers are well-perfused with normal cap refill and palp radial pulse.  Sensation is intact throughout.  On the distal aspect of the ring finger there is a small hyperkeratotic area that is  very sensitive to palpation.  It looks like there is a little bit of burrowing going deep in the central aspect.  His nail and nailbed look fine.  Assessment/Plan Patient presents with a glomus tumor in the right ring finger this is on the pulp side.  We discussed excision.  We discussed the risks include bleeding, infection, damage to surrounding structures and need for additional procedures.  I discussed the potential for a positive margin or need for additional resection.  I also discussed that just the excision itself could lead to some hypersensitivity in that area but I think it is worthwhile to try given that he has had this lesion for some time.  We will plan to do this under local in the office.  All of his questions were answered.  We will schedule to soon as possible.  Also the patient works for [employer] and I think it would be fine for him to do light duty after this is excised and if everything is healing up after couple weeks and he should go back to full duty if he is ready.  Cindra Presume 06/08/2020, 2:43 PM

## 2020-06-13 ENCOUNTER — Telehealth: Payer: Self-pay

## 2020-06-13 NOTE — Telephone Encounter (Signed)
LMOM for OV on 9/9

## 2020-06-15 ENCOUNTER — Ambulatory Visit: Payer: 59 | Admitting: Nurse Practitioner

## 2020-06-19 ENCOUNTER — Other Ambulatory Visit: Payer: Self-pay

## 2020-06-19 ENCOUNTER — Encounter: Payer: Self-pay | Admitting: Plastic Surgery

## 2020-06-19 ENCOUNTER — Other Ambulatory Visit (HOSPITAL_COMMUNITY)
Admission: RE | Admit: 2020-06-19 | Discharge: 2020-06-19 | Disposition: A | Discharge status: Home / Self Care | Payer: 59 | Source: Ambulatory Visit | Attending: Plastic Surgery | Admitting: Plastic Surgery

## 2020-06-19 ENCOUNTER — Ambulatory Visit (INDEPENDENT_AMBULATORY_CARE_PROVIDER_SITE_OTHER): Payer: 59 | Admitting: Plastic Surgery

## 2020-06-19 VITALS — BP 138/84 | HR 92 | Temp 98.2°F

## 2020-06-19 DIAGNOSIS — D1800 Hemangioma unspecified site: Secondary | ICD-10-CM

## 2020-06-19 NOTE — Progress Notes (Signed)
Operative Note   DATE OF OPERATION: 06/19/2020  LOCATION:    SURGICAL DEPARTMENT: Plastic Surgery  PREOPERATIVE DIAGNOSES:  Right ring finger glomus tumor  POSTOPERATIVE DIAGNOSES:  same  PROCEDURE:  1. Excision of right ring finger glomus tumor measuring 1.5 cm 2. Simple closure measuring 1.5 cm  SURGEON: Talmadge Coventry, MD  ANESTHESIA:  Local  COMPLICATIONS: None.   INDICATIONS FOR PROCEDURE:  The patient, Gary Price is a 29 y.o. male born on July 17, 1991, is here for treatment of right ring finger glomus tumor. MRN: [id number]  CONSENT:  Informed consent was obtained directly from the patient. Risks, benefits and alternatives were fully discussed. Specific risks including but not limited to bleeding, infection, hematoma, seroma, scarring, pain, infection, wound healing problems, and need for further surgery were all discussed. The patient did have an ample opportunity to have questions answered to satisfaction.   DESCRIPTION OF PROCEDURE:  Local anesthesia was administered. The patient's operative site was prepped and draped in a sterile fashion. A time out was performed and all information was confirmed to be correct.  The lesion was excised with a 15 blade.  Hemostasis was obtained.  Closure was done with two interrupted chromic sutures.  The lesion excised measured 1.5 cm, and the total length of closure measured 1.5 cm.    The patient tolerated the procedure well.  There were no complications.

## 2020-06-19 NOTE — Addendum Note (Signed)
Addended by: Cindra Presume on: 06/19/2020 03:41 PM   Modules accepted: Orders

## 2020-06-21 LAB — SURGICAL PATHOLOGY

## 2020-07-05 ENCOUNTER — Institutional Professional Consult (permissible substitution): Payer: 59 | Admitting: Plastic Surgery

## 2020-07-09 NOTE — Progress Notes (Signed)
Subjective:     Patient ID: Gary Price, male    DOB: 19-May-1991, 29 y.o.   MRN: [id number]  Chief Complaint  Patient presents with  . Follow-up    HPI: The patient is a 29 y.o. male here for follow-up after undergoing excision of right ring finger glomus tumor (1.5 cm) with simple closure on 06/19/2020 with Dr. Claudia Desanctis..  Surgical pathology results (reviewed with patient): Residual glomus tumor.  Tumor focally present at inked margin  ~ 3 weeks PO Patient reports overall he is doing well.  Denies fever/chills, nausea/vomiting.  He reports some tenderness and sensitivity at the excision site at the tip of his finger as well as the local numbing injection site.  Incision site is healed nicely, C/D/I.  No signs of infection, redness, drainage.  He has full range of motion of his ring finger as well as full motor and sensation intact.  Review of Systems  Constitutional: Negative for chills and fever.  HENT: Negative for congestion and sore throat.   Respiratory: Negative for cough and shortness of breath.   Cardiovascular: Negative for chest pain and palpitations.  Gastrointestinal: Negative for abdominal pain, nausea and vomiting.  Skin: Negative for wound.     Objective:   Vital Signs BP 128/88 (BP Location: Left Arm, Patient Position: Sitting, Cuff Size: Large)   Pulse 97   Temp 98.3 F (36.8 C) (Oral)   SpO2 97%  Vital Signs and Nursing Note Reviewed  Physical Exam Constitutional:      General: He is not in acute distress.    Appearance: Normal appearance. He is normal weight. He is not ill-appearing.  HENT:     Head: Normocephalic and atraumatic.  Eyes:     Extraocular Movements: Extraocular movements intact.  Pulmonary:     Effort: Pulmonary effort is normal.  Musculoskeletal:        General: Normal range of motion.     Cervical back: Normal range of motion.     Comments: Incision is healed well, C/D/I.  No signs of infection, redness, drainage.  Skin:     General: Skin is warm and dry.     Coloration: Skin is not pale.     Findings: No erythema or rash.  Neurological:     Mental Status: He is alert and oriented to person, place, and time.     Gait: Gait is intact.  Psychiatric:        Mood and Affect: Mood and affect normal.        Behavior: Behavior normal.        Thought Content: Thought content normal.        Cognition and Memory: Memory normal.        Judgment: Judgment normal.       Assessment/Plan:     ICD-10-CM   1. Glomus tumor  D18.00    Patient is doing well.  Incision is healing nicely, C/D/I.  No signs of infection, redness, drainage.  Some sensitivity and tenderness is expected at the tip of the finger.  Recommend giving it 6 to 8 weeks to settle down.  Follow-up as needed.  Call office with any questions/concerns.  The [place] was signed into law in 2016 which includes the topic of electronic health records.  This provides immediate access to information in MyChart.  This includes consultation notes, operative notes, office notes, lab results and pathology reports.  If you have any questions about what you read please let  us know at your next visit or call us at the office.  We are right here with you.   Threasa Heads, PA-C 07/12/2020, 4:14 PM

## 2020-07-11 ENCOUNTER — Encounter: Payer: 59 | Admitting: Dermatology

## 2020-07-12 ENCOUNTER — Ambulatory Visit (INDEPENDENT_AMBULATORY_CARE_PROVIDER_SITE_OTHER): Payer: 59 | Admitting: Plastic Surgery

## 2020-07-12 ENCOUNTER — Encounter: Payer: Self-pay | Admitting: Plastic Surgery

## 2020-07-12 ENCOUNTER — Other Ambulatory Visit: Payer: Self-pay

## 2020-07-12 VITALS — BP 128/88 | HR 97 | Temp 98.3°F

## 2020-07-12 DIAGNOSIS — D1800 Hemangioma unspecified site: Secondary | ICD-10-CM

## 2020-07-26 ENCOUNTER — Other Ambulatory Visit: Payer: Self-pay

## 2020-07-26 ENCOUNTER — Encounter: Payer: Self-pay | Admitting: Hospice and Palliative Medicine

## 2020-07-26 ENCOUNTER — Ambulatory Visit: Payer: 59 | Admitting: Hospice and Palliative Medicine

## 2020-07-26 DIAGNOSIS — Z79899 Other long term (current) drug therapy: Secondary | ICD-10-CM | POA: Diagnosis not present

## 2020-07-26 DIAGNOSIS — E782 Mixed hyperlipidemia: Secondary | ICD-10-CM | POA: Diagnosis not present

## 2020-07-26 DIAGNOSIS — F988 Other specified behavioral and emotional disorders with onset usually occurring in childhood and adolescence: Secondary | ICD-10-CM

## 2020-07-26 LAB — POCT URINE DRUG SCREEN
Methylenedioxyamphetamine: NOT DETECTED
POC Amphetamine UR: NOT DETECTED
POC BENZODIAZEPINES UR: NOT DETECTED
POC Barbiturate UR: NOT DETECTED
POC Cocaine UR: NOT DETECTED
POC Ecstasy UR: NOT DETECTED
POC Marijuana UR: NOT DETECTED
POC Methadone UR: NOT DETECTED
POC Methamphetamine UR: NOT DETECTED
POC Opiate Ur: NOT DETECTED
POC Oxycodone UR: NOT DETECTED
POC PHENCYCLIDINE UR: NOT DETECTED
POC TRICYCLICS UR: NOT DETECTED

## 2020-07-26 MED ORDER — METHYLPHENIDATE HCL ER (OSM) 54 MG PO TBCR: 54.0000 mg | EXTENDED_RELEASE_TABLET | ORAL | 0 refills | Status: DC

## 2020-07-26 NOTE — Progress Notes (Signed)
[facility] [place], [place] [address]  Internal MEDICINE  Office Visit Note  Patient Name: Gary Price  [id number]  [id number]  Date of Service: 07/29/2020  Chief Complaint  Patient presents with  . Follow-up    refill request  . Hypertension  . 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 methylphenidate 54 MG PO CR tablet Take 1 tablet (54 mg total) by mouth every morning.  Marland Kitchen omeprazole (PRILOSEC OTC) 20 MG tablet Take by mouth.  . rosuvastatin (CRESTOR) 5 MG tablet Take 1 tablet (5 mg total) by mouth daily.  . [DISCONTINUED] methylphenidate 54 MG PO CR tablet Take 1 tablet (54 mg total) by mouth every morning.   No facility-administered encounter medications on file as of 07/26/2020.    Surgical History: History reviewed. No pertinent surgical history.  Medical History: Past Medical History:  Diagnosis Date  . ADD (attention deficit disorder)   . GERD (gastroesophageal reflux disease)   . Hyperlipidemia     Family History: Family History  Problem Relation Age of Onset  . Diabetes Father   . Cancer Paternal Grandmother     Social History   Socioeconomic History  . Marital status: Single    Spouse name:  Not on file  . Number of children: Not on file  . Years of education: Not on file  . Highest education level: Not on file  Occupational History  . Not on file  Tobacco Use  . Smoking status: Never Smoker  . Smokeless tobacco: Never Used  Vaping Use  . Vaping Use: Never used  Substance and Sexual Activity  . Alcohol use: No  . Drug use: No  . Sexual activity: Not on file  Other Topics Concern  . Not on file  Social History Narrative  . Not on file   Social Determinants of Health   Financial Resource Strain:   . Difficulty of Paying Living Expenses: Not on file  Food Insecurity:   . Worried About Charity fundraiser in the Last Year: Not on file  . Ran Out of Food in the Last Year: Not on file  Transportation Needs:   . Lack of Transportation (Medical): Not on file  . Lack of Transportation (Non-Medical): Not on file  Physical Activity:   . Days of Exercise per Week: Not on file  . Minutes of Exercise per Session: Not on file  Stress:   . Feeling of Stress : Not on file  Social Connections:   . Frequency of Communication with Friends and Family: Not on file  . Frequency of Social Gatherings with Friends and Family: Not on file  . Attends Religious Services: Not on file  .  Active Member of Clubs or Organizations: Not on file  . Attends Archivist Meetings: Not on file  . Marital Status: Not on file  Intimate Partner Violence:   . Fear of Current or Ex-Partner: Not on file  . Emotionally Abused: Not on file  . Physically Abused: Not on file  . Sexually Abused: Not on file   Review of Systems  Constitutional: Negative for chills, diaphoresis, fatigue and unexpected weight change.  HENT: Negative for congestion, postnasal drip, rhinorrhea, sneezing and sore throat.   Eyes: Negative for photophobia, redness and visual disturbance.  Respiratory: Negative for cough, chest tightness and shortness of breath.   Cardiovascular: Negative for chest pain, palpitations  and leg swelling.  Gastrointestinal: Negative for abdominal pain, constipation, diarrhea, nausea and vomiting.  Genitourinary: Negative for dysuria and frequency.  Musculoskeletal: Negative for arthralgias, back pain, joint swelling and neck pain.  Skin: Negative for rash.  Neurological: Negative for dizziness, tremors, weakness, light-headedness, numbness and headaches.  Hematological: Negative for adenopathy. Does not bruise/bleed easily.  Psychiatric/Behavioral: Negative for behavioral problems (Depression), sleep disturbance and suicidal ideas. The patient is not nervous/anxious.     Vital Signs: BP 128/82   Pulse 90   Temp 98.2 F (36.8 C)   Resp 16   Ht 6\' 3"  (1.905 m)   Wt 238 lb 3.2 oz (108 kg)   SpO2 98%   BMI 29.77 kg/m    Physical Exam Vitals reviewed.  Constitutional:      Appearance: Normal appearance.  Cardiovascular:     Rate and Rhythm: Normal rate and regular rhythm.     Pulses: Normal pulses.     Heart sounds: Normal heart sounds.  Pulmonary:     Effort: Pulmonary effort is normal.     Breath sounds: Normal breath sounds.  Musculoskeletal:        General: Normal range of motion.  Skin:    General: Skin is warm.  Neurological:     General: No focal deficit present.     Mental Status: He is alert and oriented to person, place, and time. Mental status is at baseline.  Psychiatric:        Mood and Affect: Mood normal.        Behavior: Behavior normal.        Thought Content: Thought content normal.    Assessment/Plan: 1. Mixed hyperlipidemia Continue with rosuvastatin at this time and will continue to routinely monitor lipid panel  2. Attention deficit disorder (ADD) in adult Symptoms remain stable at this time, will continue with current dosage and frequency 30 day supply provided today due to new controlled substance policy - methylphenidate 54 MG PO CR tablet; Take 1 tablet (54 mg total) by mouth every morning.  Dispense: 30 tablet; Refill:  0  3. Encounter for long-term (current) use of high-risk medication - POCT Urine Drug Screen  General Counseling: Gary Price verbalizes understanding of the findings of todays visit and agrees with plan of treatment. I have discussed any further diagnostic evaluation that may be needed or ordered today. We also reviewed his medications today. he has been encouraged to call the office with any questions or concerns that should arise related to todays visit.    Orders Placed This Encounter  Procedures  . POCT Urine Drug Screen    Meds ordered this encounter  Medications  . methylphenidate 54 MG PO CR tablet    Sig: Take 1 tablet (54 mg total) by mouth every morning.  Dispense:  30 tablet    Refill:  0    Fill after 05/26/2020    Time spent: 30 Minutes Time spent includes review of chart, medications, test results and follow-up plan with the patient.  This patient was seen by Theodoro Grist AGNP-C in Collaboration with Dr Lavera Guise as a part of collaborative care agreement     Tanna Furry. Glorimar Stroope AGNP-C Internal medicine

## 2020-07-29 ENCOUNTER — Encounter: Payer: Self-pay | Admitting: Hospice and Palliative Medicine

## 2020-08-25 ENCOUNTER — Encounter: Payer: Self-pay | Admitting: Nurse Practitioner

## 2020-08-25 ENCOUNTER — Ambulatory Visit: Payer: 59 | Admitting: Nurse Practitioner

## 2020-08-25 VITALS — BP 154/90 | HR 75 | Temp 98.0°F | Resp 16 | Ht 75.0 in | Wt 236.2 lb

## 2020-08-25 DIAGNOSIS — E782 Mixed hyperlipidemia: Secondary | ICD-10-CM

## 2020-08-25 DIAGNOSIS — F988 Other specified behavioral and emotional disorders with onset usually occurring in childhood and adolescence: Secondary | ICD-10-CM

## 2020-08-25 DIAGNOSIS — T7840XD Allergy, unspecified, subsequent encounter: Secondary | ICD-10-CM | POA: Diagnosis not present

## 2020-08-25 DIAGNOSIS — T7840XA Allergy, unspecified, initial encounter: Secondary | ICD-10-CM | POA: Insufficient documentation

## 2020-08-25 MED ORDER — METHYLPHENIDATE HCL ER (OSM) 54 MG PO TBCR: 54.0000 mg | EXTENDED_RELEASE_TABLET | ORAL | 0 refills | Status: DC

## 2020-08-25 MED ORDER — ROSUVASTATIN CALCIUM 5 MG PO TABS: 5.0000 mg | ORAL_TABLET | Freq: Every day | ORAL | 3 refills | Status: DC

## 2020-08-25 NOTE — Progress Notes (Signed)
[facility] [place] [place], [place] [address]  Internal MEDICINE  Office Visit Note  Patient Name: Gary Price  [id number]  [id number]  Date of Service: 10/01/2020  Chief Complaint  Patient presents with  . Follow-up    pt had allergic reaction to something last weekend, discussing coming in every month  . Hyperlipidemia  . Gastroesophageal Reflux  . policy update form    received    The patient is here for follow up visit. He had routine, fasting labs done in 04/2020. His total cholesterol was 217 with DL at 37 and HDL of 156. His AST was mildly elevated. blood pressure is mildly elevated today. His family does have strong history of CAD. He was started on Crestor 5mg  daily. The patient has been improved his diet and is exercising more frequently.  He has not had cholesterol panel rechecked.  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 The patient was recently seen in ER for spreading rash on his bilateral arms, arm pits, thighs, groin, top of feet and lower abdomen  His labs were negative for Alpha gal enzyme and CRP was elevated. He is still unsure of what caused this allergic type reaction. Rash has subsided.        Current Medication: Outpatient Encounter Medications as of 08/25/2020  Medication Sig  . methylphenidate 54 MG PO CR tablet Take 1 tablet (54 mg total) by mouth every morning.  Marland Kitchen omeprazole (PRILOSEC OTC) 20 MG tablet Take by mouth.  . rosuvastatin (CRESTOR) 5 MG tablet Take 1 tablet (5 mg total) by mouth  daily.  . [DISCONTINUED] methylphenidate 54 MG PO CR tablet Take 1 tablet (54 mg total) by mouth every morning.  . [DISCONTINUED] methylphenidate 54 MG PO CR tablet Take 1 tablet (54 mg total) by mouth every morning.  . [DISCONTINUED] rosuvastatin (CRESTOR) 5 MG tablet Take 1 tablet (5 mg total) by mouth daily.   No facility-administered encounter medications on file as of 08/25/2020.    Surgical History: History reviewed. No pertinent surgical history.  Medical History: Past Medical History:  Diagnosis Date  . ADD (attention deficit disorder)   . GERD (gastroesophageal reflux disease)   . Hyperlipidemia     Family History: Family History  Problem Relation Age of Onset  . Diabetes Father   . Cancer Paternal Grandmother     Social History   Socioeconomic History  . Marital status: Single    Spouse name: Not on file  . Number of children: Not on file  . Years of education: Not on file  . Highest education level: Not on file  Occupational History  . Not on file  Tobacco Use  . Smoking status: Never Smoker  . Smokeless tobacco: Never Used  Vaping Use  . Vaping Use: Never used  Substance and Sexual Activity  . Alcohol use: No  . Drug use: No  . Sexual activity: Not on file  Other Topics Concern  . Not on file  Social  History Narrative  . Not on file   Social Determinants of Health   Financial Resource Strain: Not on file  Food Insecurity: Not on file  Transportation Needs: Not on file  Physical Activity: Not on file  Stress: Not on file  Social Connections: Not on file  Intimate Partner Violence: Not on file      Review of Systems  Constitutional: Negative for activity change, chills, fatigue and unexpected weight change.  HENT: Negative for congestion, postnasal drip, rhinorrhea, sneezing and sore throat.   Respiratory: Negative for cough, chest tightness, shortness of breath and wheezing.   Cardiovascular: Negative for chest pain and palpitations.        Mildly elevated blood pressure today.   Gastrointestinal: Negative for abdominal pain, constipation, diarrhea, nausea and vomiting.       Intermittent acid reflux.   Endocrine: Negative for cold intolerance, heat intolerance, polydipsia and polyuria.  Musculoskeletal: Negative for arthralgias, back pain, joint swelling, myalgias and neck pain.  Skin: Positive for rash.       History of rash on arms, legs, trunk, abdomen, neck, and face. Unclear cause.   Allergic/Immunologic: Negative for environmental allergies.  Neurological: Negative for dizziness, tremors, numbness and headaches.  Hematological: Negative for adenopathy. Does not bruise/bleed easily.  Psychiatric/Behavioral: Positive for decreased concentration. Negative for behavioral problems (Depression), sleep disturbance and suicidal ideas. The patient is not nervous/anxious.     Today's Vitals   08/25/20 1531  BP: (!) 154/90  Pulse: 75  Resp: 16  Temp: 98 F (36.7 C)  SpO2: 98%  Weight: 236 lb 3.2 oz (107.1 kg)  Height: 6\' 3"  (1.905 m)   Body mass index is 29.52 kg/m.  Physical Exam Vitals and nursing note reviewed.  Constitutional:      General: He is not in acute distress.    Appearance: Normal appearance. He is well-developed. He is not diaphoretic.  HENT:     Head: Normocephalic and atraumatic.     Nose: Nose normal.     Mouth/Throat:     Pharynx: No oropharyngeal exudate.  Eyes:     Pupils: Pupils are equal, round, and reactive to light.  Neck:     Thyroid: No thyromegaly.     Vascular: No JVD.     Trachea: No tracheal deviation.  Cardiovascular:     Rate and Rhythm: Normal rate and regular rhythm.     Heart sounds: Normal heart sounds. No murmur heard. No friction rub. No gallop.   Pulmonary:     Effort: Pulmonary effort is normal. No respiratory distress.     Breath sounds: Normal breath sounds. No wheezing or rales.  Chest:     Chest wall: No tenderness.  Abdominal:     Palpations: Abdomen is  soft.  Musculoskeletal:        General: Normal range of motion.     Cervical back: Normal range of motion and neck supple.  Lymphadenopathy:     Cervical: No cervical adenopathy.  Skin:    General: Skin is warm and dry.  Neurological:     Mental Status: He is alert and oriented to person, place, and time.     Cranial Nerves: No cranial nerve deficit.  Psychiatric:        Mood and Affect: Mood normal.        Behavior: Behavior normal.        Thought Content: Thought content normal.        Judgment: Judgment normal.    Assessment/Plan:  1. Allergic reaction, subsequent encounter Will check blood for basic food allergens and regional seasonal allergens.   2. Mixed hyperlipidemia Check fasting lipid panel and liver functions with labs. Adjust rosuvastatin as indicated.  - rosuvastatin (CRESTOR) 5 MG tablet; Take 1 tablet (5 mg total) by mouth daily.  Dispense: 30 tablet; Refill: 3  3. Attention deficit disorder (ADD) in adult May take Concrerta 54mg  CR daily. Two 30 day prescriptions were sent to his pharmacy. Dates are 08/25/2020 and 09/22/2020 - methylphenidate 54 MG PO CR tablet; Take 1 tablet (54 mg total) by mouth every morning.  Dispense: 30 tablet; Refill: 0  General Counseling: Dinero verbalizes understanding of the findings of todays visit and agrees with plan of treatment. I have discussed any further diagnostic evaluation that may be needed or ordered today. We also reviewed his medications today. he has been encouraged to call the office with any questions or concerns that should arise related to todays visit.  Refilled Controlled medications today. Reviewed risks and possible side effects associated with taking Stimulants. Combination of these drugs with other psychotropic medications could cause dizziness and drowsiness. Pt needs to Monitor symptoms and exercise caution in driving and operating heavy machinery to avoid damages to oneself, to others and to the surroundings.  Patient verbalized understanding in this matter. Dependence and abuse for these drugs will be monitored closely. A Controlled substance policy and procedure is on file which allows [facility] to order a urine drug screen test at any visit. Patient understands and agrees with the plan.  This patient was seen by [place] with Dr Lavera Guise as a part of collaborative care agreement   Meds ordered this encounter  Medications  . DISCONTD: methylphenidate 54 MG PO CR tablet    Sig: Take 1 tablet (54 mg total) by mouth every morning.    Dispense:  30 tablet    Refill:  0    Order Specific Question:   Supervising Provider    Answer:   Lavera Guise [[id number]]  . methylphenidate 54 MG PO CR tablet    Sig: Take 1 tablet (54 mg total) by mouth every morning.    Dispense:  30 tablet    Refill:  0    Fill after 09/22/2020    Order Specific Question:   Supervising Provider    Answer:   Lavera Guise [[id number]]  . rosuvastatin (CRESTOR) 5 MG tablet    Sig: Take 1 tablet (5 mg total) by mouth daily.    Dispense:  30 tablet    Refill:  3    Order Specific Question:   Supervising Provider    Answer:   Lavera Guise [[id number]]    Total time spent: 25 Minutes   Time spent includes review of chart, medications, test results, and follow up plan with the patient.      Dr Lavera Guise Internal medicine

## 2020-09-04 ENCOUNTER — Other Ambulatory Visit: Payer: Self-pay | Admitting: Nurse Practitioner

## 2020-09-06 NOTE — Progress Notes (Signed)
Cholesterol improved. [place] on other results.

## 2020-09-07 LAB — CBC
Hematocrit: 42.9 % (ref 37.5–51.0)
Hemoglobin: 14.9 g/dL (ref 13.0–17.7)
MCH: 29.1 pg (ref 26.6–33.0)
MCHC: 34.7 g/dL (ref 31.5–35.7)
MCV: 84 fL (ref 79–97)
Platelets: 281 10*3/uL (ref 150–450)
RBC: 5.12 x10E6/uL (ref 4.14–5.80)
RDW: 11.9 % (ref 11.6–15.4)
WBC: 6.7 10*3/uL (ref 3.4–10.8)

## 2020-09-07 LAB — ALLERGENS W/COMP RFLX AREA 2
Alternaria Alternata IgE: <0.1 kU/L
Aspergillus Fumigatus IgE: <0.1 kU/L
Bermuda Grass IgE: <0.1 kU/L
Cedar, Mountain IgE: <0.1 kU/L
Cladosporium Herbarum IgE: <0.1 kU/L
Cockroach, German IgE: <0.1 kU/L
Common Silver Birch IgE: <0.1 kU/L
Cottonwood IgE: <0.1 kU/L
D Farinae IgE: <0.1 kU/L
D Pteronyssinus IgE: <0.1 kU/L
E001-IgE Cat Dander: <0.1 kU/L
E005-IgE Dog Dander: <0.1 kU/L
Elm, American IgE: <0.1 kU/L
IgE (Immunoglobulin E), Serum: 24 IU/mL (ref 6–495)
Johnson Grass IgE: <0.1 kU/L
Maple/Box Elder IgE: <0.1 kU/L
Mouse Urine IgE: <0.1 kU/L
Oak, White IgE: <0.1 kU/L
Pecan, Hickory IgE: 0.14 kU/L — AB
Penicillium Chrysogen IgE: <0.1 kU/L
Pigweed, Rough IgE: <0.1 kU/L
Ragweed, Short IgE: <0.1 kU/L
Sheep Sorrel IgE Qn: <0.1 kU/L
Timothy Grass IgE: 0.29 kU/L — AB
White Mulberry IgE: <0.1 kU/L

## 2020-09-07 LAB — COMPREHENSIVE METABOLIC PANEL
ALT: 93 IU/L — ABNORMAL HIGH (ref 0–44)
AST: 36 IU/L (ref 0–40)
Albumin/Globulin Ratio: 2 (ref 1.2–2.2)
Albumin: 4.7 g/dL (ref 4.1–5.2)
Alkaline Phosphatase: 109 IU/L (ref 44–121)
BUN/Creatinine Ratio: 11 (ref 9–20)
BUN: 11 mg/dL (ref 6–20)
Bilirubin Total: 0.3 mg/dL (ref 0.0–1.2)
CO2: 25 mmol/L (ref 20–29)
Calcium: 9.8 mg/dL (ref 8.7–10.2)
Chloride: 102 mmol/L (ref 96–106)
Creatinine, Ser: 1.03 mg/dL (ref 0.76–1.27)
GFR calc Af Amer: 114 mL/min/{1.73_m2} (ref >59)
GFR calc non Af Amer: 98 mL/min/{1.73_m2} (ref >59)
Globulin, Total: 2.3 g/dL (ref 1.5–4.5)
Glucose: 104 mg/dL — ABNORMAL HIGH (ref 65–99)
Potassium: 4.9 mmol/L (ref 3.5–5.2)
Sodium: 141 mmol/L (ref 134–144)
Total Protein: 7 g/dL (ref 6.0–8.5)

## 2020-09-07 LAB — FOOD ALLERGY PROFILE
Allergen Corn, IgE: <0.1 kU/L
Clam IgE: <0.1 kU/L
Codfish IgE: <0.1 kU/L
Egg White IgE: <0.1 kU/L
Milk IgE: <0.1 kU/L
Peanut IgE: <0.1 kU/L
Scallop IgE: <0.1 kU/L
Sesame Seed IgE: <0.1 kU/L
Shrimp IgE: <0.1 kU/L
Soybean IgE: <0.1 kU/L
Walnut IgE: <0.1 kU/L
Wheat IgE: <0.1 kU/L

## 2020-09-07 LAB — LIPID PANEL WITH LDL/HDL RATIO
Cholesterol, Total: 192 mg/dL (ref 100–199)
HDL: 45 mg/dL (ref >39)
LDL Chol Calc (NIH): 120 mg/dL — ABNORMAL HIGH (ref 0–99)
LDL/HDL Ratio: 2.7 ratio (ref 0.0–3.6)
Triglycerides: 154 mg/dL — ABNORMAL HIGH (ref 0–149)
VLDL Cholesterol Cal: 27 mg/dL (ref 5–40)

## 2020-09-07 LAB — C-REACTIVE PROTEIN: CRP: <1 mg/L (ref 0–10)

## 2020-09-12 ENCOUNTER — Telehealth: Payer: Self-pay | Admitting: Nurse Practitioner

## 2020-09-12 ENCOUNTER — Telehealth: Payer: Self-pay

## 2020-09-12 NOTE — Telephone Encounter (Signed)
Yep..ibuprofen sent this message to [facility] before she left today. It's in the chart.Gary KitchenMarland KitchenYes, I was waiting for allergen profile to come back which is positive for timothy grass and pecan/hickory trees. Cholesterol panel has improved. Liver function still elevated. I would like to go ahead and get abdominal ultrasound for further evaluation. We can go ahead and get this set up form him. Thanks.

## 2020-09-12 NOTE — Telephone Encounter (Signed)
Yes, I was waiting for allergen profile to come back which is positive for timothy grass and pecan/hickory trees. Cholesterol panel has improved. Liver function still elevated. I would like to go ahead and get abdominal ultrasound for further evaluation. We can go ahead and get this set up form him. Thanks.

## 2020-09-12 NOTE — Telephone Encounter (Signed)
Patient called requesting lab results. Can you review and relay to nurse? Thank you.

## 2020-09-12 NOTE — Progress Notes (Signed)
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  Elevated liver function. Will get ultrasound of abdomen for further evaluation.

## 2020-09-13 NOTE — Telephone Encounter (Signed)
Patient wife has been advised of lab results and scheduled abdomen ultrasound. Gary Price

## 2020-09-21 ENCOUNTER — Other Ambulatory Visit: Payer: Self-pay

## 2020-09-21 DIAGNOSIS — R945 Abnormal results of liver function studies: Secondary | ICD-10-CM

## 2020-09-25 ENCOUNTER — Telehealth: Payer: Self-pay

## 2020-09-25 NOTE — Telephone Encounter (Signed)
Confirmed ultrasound for 09-27-20 ov advised patient of copay and nothing to eat or drink after midnight the night prior to. Gary Price

## 2020-09-27 ENCOUNTER — Other Ambulatory Visit: Payer: Self-pay

## 2020-09-27 ENCOUNTER — Ambulatory Visit: Payer: 59

## 2020-09-27 DIAGNOSIS — R945 Abnormal results of liver function studies: Secondary | ICD-10-CM

## 2020-10-17 ENCOUNTER — Encounter: Payer: 59 | Admitting: Nurse Practitioner

## 2020-10-20 ENCOUNTER — Telehealth: Payer: Self-pay

## 2020-10-20 NOTE — Telephone Encounter (Signed)
Rescheduled missed ov from 10-17-20. Courtney °

## 2020-10-26 ENCOUNTER — Ambulatory Visit: Payer: 59 | Admitting: Nurse Practitioner

## 2020-10-27 ENCOUNTER — Other Ambulatory Visit: Payer: Self-pay | Admitting: Hospice and Palliative Medicine

## 2020-10-27 DIAGNOSIS — F988 Other specified behavioral and emotional disorders with onset usually occurring in childhood and adolescence: Secondary | ICD-10-CM

## 2020-10-27 MED ORDER — METHYLPHENIDATE HCL ER (OSM) 54 MG PO TBCR: 54.0000 mg | EXTENDED_RELEASE_TABLET | ORAL | 0 refills | Status: DC

## 2020-11-20 ENCOUNTER — Other Ambulatory Visit: Payer: Self-pay

## 2020-11-20 DIAGNOSIS — E782 Mixed hyperlipidemia: Secondary | ICD-10-CM

## 2020-11-20 MED ORDER — ROSUVASTATIN CALCIUM 5 MG PO TABS: 5.0000 mg | ORAL_TABLET | Freq: Every day | ORAL | 3 refills | Status: DC

## 2020-11-21 ENCOUNTER — Encounter: Payer: Self-pay | Admitting: Hospice and Palliative Medicine

## 2020-11-21 ENCOUNTER — Other Ambulatory Visit: Payer: Self-pay

## 2020-11-21 ENCOUNTER — Ambulatory Visit (INDEPENDENT_AMBULATORY_CARE_PROVIDER_SITE_OTHER): Payer: 59 | Admitting: Hospice and Palliative Medicine

## 2020-11-21 VITALS — BP 136/84 | HR 94 | Temp 97.8°F | Resp 16 | Ht 75.0 in | Wt 241.0 lb

## 2020-11-21 DIAGNOSIS — F988 Other specified behavioral and emotional disorders with onset usually occurring in childhood and adolescence: Secondary | ICD-10-CM

## 2020-11-21 DIAGNOSIS — R945 Abnormal results of liver function studies: Secondary | ICD-10-CM | POA: Diagnosis not present

## 2020-11-21 DIAGNOSIS — E782 Mixed hyperlipidemia: Secondary | ICD-10-CM

## 2020-11-21 MED ORDER — METHYLPHENIDATE HCL ER (OSM) 54 MG PO TBCR: 54.0000 mg | EXTENDED_RELEASE_TABLET | ORAL | 0 refills | Status: DC

## 2020-11-21 NOTE — Progress Notes (Signed)
[facility] [place], [place] [address]  Internal MEDICINE  Office Visit Note  Patient Name: Gary Price  [id number]  [id number]  Date of Service: 11/25/2020  Chief Complaint  Patient presents with  . Follow-up  . 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 methylphenidate 54 MG PO CR tablet Take 1 tablet (54 mg total) by mouth every morning.  . rosuvastatin (CRESTOR) 5 MG tablet Take 1 tablet (5 mg total) by mouth daily.  . [DISCONTINUED] methylphenidate 54 MG PO CR tablet Take 1 tablet (54 mg total) by mouth every morning.  . [DISCONTINUED] omeprazole (PRILOSEC OTC) 20 MG tablet Take by mouth.  . methylphenidate 54 MG PO CR tablet Take 1 tablet (54 mg total) by mouth every morning.  . [DISCONTINUED] methylphenidate 54 MG PO CR tablet Take 1 tablet (54 mg total) by mouth every morning.   No facility-administered encounter medications on file as of 11/21/2020.    Surgical History: History reviewed. No pertinent surgical history.  Medical History: Past Medical History:  Diagnosis Date  . ADD (attention deficit disorder)   . GERD (gastroesophageal reflux disease)   . Hyperlipidemia     Family History: Family History  Problem Relation Age of Onset  . Diabetes  Father   . Cancer Paternal Grandmother     Social History   Socioeconomic History  . Marital status: Single    Spouse name: Not on file  . Number of children: Not on file  . Years of education: Not on file  . Highest education level: Not on file  Occupational History  . Not on file  Tobacco Use  . Smoking status: Never Smoker  . Smokeless tobacco: Never Used  Vaping Use  . Vaping Use: Never used  Substance and Sexual Activity  . Alcohol use: No  . Drug use: No  . Sexual activity: Not on file  Other Topics Concern  . Not on file  Social History Narrative  . Not on file   Social Determinants of Health   Financial Resource Strain: Not on file  Food Insecurity: Not on file  Transportation Needs: Not on file  Physical Activity: Not on file  Stress: Not on file  Social Connections: Not on file  Intimate Partner Violence: Not on file      Review of Systems  Constitutional: Negative for chills, fatigue and unexpected weight change.  HENT: Negative for congestion, postnasal drip, rhinorrhea, sneezing and sore throat.   Eyes: Negative for redness.  Respiratory: Negative for cough, chest tightness and shortness of breath.   Cardiovascular: Negative for chest pain and palpitations.  Gastrointestinal: Negative for abdominal pain, constipation, diarrhea, nausea and vomiting.  Genitourinary: Negative for dysuria and frequency.  Musculoskeletal: Negative for arthralgias, back pain, joint swelling and neck pain.  Skin: Negative for rash.  Neurological:  Negative for tremors and numbness.  Hematological: Negative for adenopathy. Does not bruise/bleed easily.  Psychiatric/Behavioral: Negative for behavioral problems (Depression), sleep disturbance and suicidal ideas. The patient is not nervous/anxious.     Vital Signs: BP 136/84   Pulse 94   Temp 97.8 F (36.6 C)   Resp 16   Ht 6\' 3"  (1.905 m)   Wt 241 lb (109.3 kg)   SpO2 98%   BMI 30.12 kg/m    Physical Exam Vitals  reviewed.  Constitutional:      Appearance: Normal appearance. He is normal weight.  Cardiovascular:     Rate and Rhythm: Normal rate and regular rhythm.     Pulses: Normal pulses.     Heart sounds: Normal heart sounds.  Pulmonary:     Effort: Pulmonary effort is normal.     Breath sounds: Normal breath sounds.  Abdominal:     General: Abdomen is flat.     Palpations: Abdomen is soft.  Musculoskeletal:        General: Normal range of motion.     Cervical back: Normal range of motion.  Skin:    General: Skin is warm.  Neurological:     General: No focal deficit present.     Mental Status: He is alert and oriented to person, place, and time. Mental status is at baseline.  Psychiatric:        Mood and Affect: Mood normal.        Behavior: Behavior normal.        Thought Content: Thought content normal.        Judgment: Judgment normal.    Assessment/Plan: 1. Attention deficit disorder (ADD) in adult Continue current dose of medication - methylphenidate 54 MG PO CR tablet; Take 1 tablet (54 mg total) by mouth every morning.  Dispense: 30 tablet; Refill: 0 - methylphenidate 54 MG PO CR tablet; Take 1 tablet (54 mg total) by mouth every morning.  Dispense: 30 tablet; Refill: 0  2. Mixed hyperlipidemia Continue statin therapy--will repeat labs at next visit and recheck  3. Abnormal liver function Will recheck at next visit may consider further evaluation of elevated LFTs  General Counseling: Casimiro Needle understanding of the findings of todays visit and agrees with plan of treatment. I have discussed any further diagnostic evaluation that may be needed or ordered today. We also reviewed his medications today. he has been encouraged to call the office with any questions or concerns that should arise related to todays visit.   Meds ordered this encounter  Medications  . DISCONTD: methylphenidate 54 MG PO CR tablet    Sig: Take 1 tablet (54 mg total) by mouth every morning.     Dispense:  30 tablet    Refill:  0  . methylphenidate 54 MG PO CR tablet    Sig: Take 1 tablet (54 mg total) by mouth every morning.    Dispense:  30 tablet    Refill:  0    Do not fill before 12/21/20.  . methylphenidate 54 MG PO CR tablet    Sig: Take 1 tablet (54 mg total) by mouth every morning.    Dispense:  30 tablet    Refill:  0    Do not fill before 01/18/21.    Time spent: 30 Minutes Time spent includes review of chart, medications, test results and follow-up plan with the patient.  This patient was seen by Theodoro Grist AGNP-C in Collaboration with Dr Lavera Guise as a  part of collaborative care agreement     Tanna Furry. Amon Costilla AGNP-C Internal medicine

## 2020-11-25 ENCOUNTER — Encounter: Payer: Self-pay | Admitting: Hospice and Palliative Medicine

## 2020-12-12 ENCOUNTER — Encounter: Payer: 59 | Admitting: Hospice and Palliative Medicine

## 2020-12-22 ENCOUNTER — Encounter: Payer: Self-pay | Admitting: Physician Assistant

## 2020-12-22 ENCOUNTER — Ambulatory Visit (INDEPENDENT_AMBULATORY_CARE_PROVIDER_SITE_OTHER): Payer: 59 | Admitting: Physician Assistant

## 2020-12-22 DIAGNOSIS — J0100 Acute maxillary sinusitis, unspecified: Secondary | ICD-10-CM

## 2020-12-22 DIAGNOSIS — R0982 Postnasal drip: Secondary | ICD-10-CM

## 2020-12-22 MED ORDER — AMOXICILLIN-POT CLAVULANATE 875-125 MG PO TABS
1.0000 | ORAL_TABLET | Freq: Two times a day (BID) | ORAL | 0 refills | Status: DC
Start: 2020-12-22 — End: 2021-02-21

## 2020-12-22 NOTE — Progress Notes (Signed)
[facility] [place], [place] [address]  Internal MEDICINE  Office Visit Note  Patient Name: Gary Price  [id number]  [id number]  Date of Service: 12/22/2020   I connected with  Leafy Kindle on 12/22/20 by a video enabled telemedicine application and verified that I am speaking with the correct person using two identifiers.   I discussed the limitations of evaluation and management by telemedicine. The patient expressed understanding and agreed to proceed.   Chief Complaint  Patient presents with  . Telephone Assessment    Covid negative  . Telephone Screen    [phone]  . Sinusitis  . Sore Throat    HPI Pt is here for a sick visit. -He took a home covid test which was negative 2 days ago. -The last 5-6 days he has had a lot of congestion, mild headache, sneezing, and is coughing some. In the morning he wakes with a sore throat but that improves throughout the day. Symptoms not worsening but not getting better. Taking dayquil cold and flu and it is not helping. Has not tried mucinex. Does use flonase nasal spray.  Current Medication:  Outpatient Encounter Medications as of 12/22/2020  Medication Sig  . amoxicillin-clavulanate (AUGMENTIN) 875-125 MG tablet Take 1 tablet by mouth 2 (two) times daily. With food  . methylphenidate 54 MG PO CR tablet Take 1 tablet (54 mg total) by mouth every morning.  . methylphenidate 54 MG PO CR tablet Take 1 tablet (54 mg total) by mouth every morning.  . rosuvastatin (CRESTOR) 5 MG tablet Take 1 tablet (5 mg total) by mouth daily.   No facility-administered encounter medications on file as of 12/22/2020.      Medical History: Past Medical History:  Diagnosis Date  . ADD (attention deficit disorder)   . GERD (gastroesophageal reflux disease)   . Hyperlipidemia      Vital Signs: Ht 6\' 3"  (1.905 m)   Wt 235 lb (106.6 kg)   BMI 29.37 kg/m    Review of Systems  Constitutional: Negative for  chills, fatigue and fever.  HENT: Positive for congestion, postnasal drip, rhinorrhea, sinus pressure, sinus pain, sneezing and sore throat. Negative for mouth sores and trouble swallowing.   Respiratory: Positive for cough. Negative for shortness of breath and wheezing.   Cardiovascular: Negative for chest pain.  Gastrointestinal: Negative for diarrhea, nausea and vomiting.  Genitourinary: Negative for flank pain.  Musculoskeletal: Negative for myalgias.  Neurological: Positive for headaches.  Psychiatric/Behavioral: Negative.     Objective: Pt able to carry out conversation.  Assessment/Plan: 1. Acute non-recurrent maxillary sinusitis Will start on Augmentin, educated to take with food. Pt will also continue flonase and start on mucinex. May use tylenol for headaches as needed. Educated to stay well hydrated and f/u if not improving.  - amoxicillin-clavulanate (AUGMENTIN) 875-125 MG tablet; Take 1 tablet by mouth 2 (two) times daily. With food  Dispense: 20 tablet; Refill: 0  2. Postnasal drip Patient will continue flonase, start mucinex and Augmentin.    General Counseling: Casimiro Needle understanding of the findings of todays visit and agrees with plan of treatment. I have discussed any further diagnostic evaluation that may be needed or ordered today. We also reviewed his medications today. he has been encouraged to call the office with any questions or concerns that should arise related to todays visit.    Counseling:    No orders of the defined types were placed in this encounter.   Meds ordered  this encounter  Medications  . amoxicillin-clavulanate (AUGMENTIN) 875-125 MG tablet    Sig: Take 1 tablet by mouth 2 (two) times daily. With food    Dispense:  20 tablet    Refill:  0    Time spent:20 Minutes

## 2021-02-19 ENCOUNTER — Encounter: Payer: 59 | Admitting: Internal Medicine

## 2021-02-21 ENCOUNTER — Ambulatory Visit (INDEPENDENT_AMBULATORY_CARE_PROVIDER_SITE_OTHER): Payer: 59 | Admitting: Nurse Practitioner

## 2021-02-21 ENCOUNTER — Other Ambulatory Visit: Payer: Self-pay

## 2021-02-21 ENCOUNTER — Encounter: Payer: Self-pay | Admitting: Nurse Practitioner

## 2021-02-21 VITALS — BP 140/98 | HR 100 | Temp 98.5°F | Resp 16 | Ht 75.0 in | Wt 242.6 lb

## 2021-02-21 DIAGNOSIS — K219 Gastro-esophageal reflux disease without esophagitis: Secondary | ICD-10-CM

## 2021-02-21 DIAGNOSIS — R030 Elevated blood-pressure reading, without diagnosis of hypertension: Secondary | ICD-10-CM

## 2021-02-21 DIAGNOSIS — R945 Abnormal results of liver function studies: Secondary | ICD-10-CM

## 2021-02-21 DIAGNOSIS — E782 Mixed hyperlipidemia: Secondary | ICD-10-CM

## 2021-02-21 DIAGNOSIS — R300 Dysuria: Secondary | ICD-10-CM | POA: Diagnosis not present

## 2021-02-21 DIAGNOSIS — F988 Other specified behavioral and emotional disorders with onset usually occurring in childhood and adolescence: Secondary | ICD-10-CM | POA: Diagnosis not present

## 2021-02-21 DIAGNOSIS — Z0001 Encounter for general adult medical examination with abnormal findings: Secondary | ICD-10-CM | POA: Diagnosis not present

## 2021-02-21 MED ORDER — METHYLPHENIDATE HCL ER (OSM) 54 MG PO TBCR: 54.0000 mg | EXTENDED_RELEASE_TABLET | ORAL | 0 refills | Status: DC

## 2021-02-21 NOTE — Progress Notes (Signed)
[facility] [place], [place] [address]  Internal MEDICINE  Office Visit Note  Patient Name: Gary Price  [id number]  [id number]  Date of Service: 02/21/2021  Chief Complaint  Patient presents with  . Annual Exam  . Gastroesophageal Reflux  . Hyperlipidemia    HPI Gary Price presents for his annual physical exam. He has a history of ADHD, GERD, and hyperlipidemia. 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 Taking concerta helps him to concentrate and do his job in safe manner. He was recently treated for sinusitis which is resolving per patient. He denies any concerns today and denies any pain when asked. He is a nonsmoker and does not drink alcohol or use illicit drugs.      He has not had any labs drawn since November 2021.    Current Medication: Outpatient Encounter Medications as of 02/21/2021  Medication Sig  . methylphenidate 54 MG PO CR tablet Take 1 tablet (54 mg total) by mouth every morning.  . methylphenidate 54 MG PO CR tablet Take 1 tablet (54 mg total) by mouth every morning.  . methylphenidate 54 MG PO CR tablet Take 1 tablet (54 mg total) by mouth every morning.  . rosuvastatin (CRESTOR) 5 MG tablet Take 1 tablet (5 mg total) by mouth daily.  . [DISCONTINUED] amoxicillin-clavulanate (AUGMENTIN) 875-125 MG tablet Take 1 tablet by mouth 2 (two) times daily. With food (Patient not taking: Reported on 02/21/2021)   No facility-administered encounter medications on file as of 02/21/2021.    Surgical History: History reviewed. No pertinent surgical history.  Medical History: Past Medical History:  Diagnosis Date  . ADD (attention deficit disorder)   . GERD  (gastroesophageal reflux disease)   . Hyperlipidemia     Family History: Family History  Problem Relation Age of Onset  . Diabetes Father   . Cancer Paternal Grandmother     Social History   Socioeconomic History  . Marital status: Single    Spouse name: Not on file  . Number of children: Not on file  . Years of education: Not on file  . Highest education level: Not on file  Occupational History  . Not on file  Tobacco Use  . Smoking status: Never Smoker  . Smokeless tobacco: Never Used  Vaping Use  . Vaping Use: Never used  Substance and Sexual Activity  . Alcohol use: No  . Drug use: No  . Sexual activity: Not on file  Other Topics Concern  . Not on file  Social History Narrative  . Not on file   Social Determinants of Health   Financial Resource Strain: Not on file  Food Insecurity: Not on file  Transportation Needs: Not on file  Physical Activity: Not on file  Stress: Not on file  Social Connections: Not on file  Intimate Partner Violence: Not on file      Review of Systems  Constitutional: Negative for chills, fatigue and unexpected weight change.  HENT: Negative for congestion, postnasal drip, rhinorrhea, sneezing and sore throat.   Eyes: Negative for redness.  Respiratory: Negative for cough, chest tightness and shortness of breath.   Cardiovascular: Negative for chest pain and palpitations.  Gastrointestinal: Negative  for abdominal pain, constipation, diarrhea, nausea and vomiting.  Genitourinary: Negative for dysuria and frequency.  Musculoskeletal: Negative for arthralgias, back pain, joint swelling and neck pain.  Skin: Negative for rash.  Neurological: Negative.  Negative for tremors and numbness.  Hematological: Negative for adenopathy. Does not bruise/bleed easily.  Psychiatric/Behavioral: Negative for behavioral problems (Depression), sleep disturbance and suicidal ideas. The patient is not nervous/anxious.     Vital Signs: BP (!) 140/98    Pulse 100   Temp 98.5 F (36.9 C)   Resp 16   Ht 6' 3" (1.905 m)   Wt 242 lb 9.6 oz (110 kg)   SpO2 96%   BMI 30.32 kg/m    Physical Exam Vitals reviewed.  Constitutional:      General: He is not in acute distress.    Appearance: He is well-developed. He is obese. He is not ill-appearing or diaphoretic.  HENT:     Head: Normocephalic and atraumatic.     Right Ear: Tympanic membrane, ear canal and external ear normal.     Left Ear: Tympanic membrane, ear canal and external ear normal.     Nose: Nose normal.     Mouth/Throat:     Mouth: Mucous membranes are moist.     Pharynx: Oropharynx is clear. No oropharyngeal exudate or posterior oropharyngeal erythema.  Eyes:     General: No scleral icterus.       Right eye: No discharge.        Left eye: No discharge.     Extraocular Movements: Extraocular movements intact.     Conjunctiva/sclera: Conjunctivae normal.     Pupils: Pupils are equal, round, and reactive to light.  Neck:     Thyroid: No thyromegaly.     Vascular: No JVD.     Trachea: No tracheal deviation.  Cardiovascular:     Rate and Rhythm: Normal rate and regular rhythm.     Pulses: Normal pulses.     Heart sounds: Normal heart sounds. No murmur heard. No friction rub. No gallop.   Pulmonary:     Effort: Pulmonary effort is normal. No respiratory distress.     Breath sounds: Normal breath sounds. No stridor. No wheezing or rales.  Chest:     Chest wall: No tenderness.  Abdominal:     General: Bowel sounds are normal. There is no distension.     Palpations: Abdomen is soft. There is no mass.     Tenderness: There is no abdominal tenderness. There is no guarding or rebound.  Musculoskeletal:        General: No tenderness or deformity. Normal range of motion.     Cervical back: Normal range of motion and neck supple.  Lymphadenopathy:     Cervical: No cervical adenopathy.  Skin:    General: Skin is warm and dry.     Capillary Refill: Capillary refill takes  less than 2 seconds.     Coloration: Skin is not pale.     Findings: No erythema or rash.  Neurological:     Mental Status: He is alert and oriented to person, place, and time.     Cranial Nerves: No cranial nerve deficit.     Motor: No abnormal muscle tone.     Coordination: Coordination normal.     Deep Tendon Reflexes: Reflexes are normal and symmetric.  Psychiatric:        Mood and Affect: Mood normal.        Behavior: Behavior normal.  Thought Content: Thought content normal.        Judgment: Judgment normal.    Assessment/Plan: 1. Encounter for routine adult health examination with abnormal findings Gary Price had his annual physical exam today. Routine labs ordered. Will call with results.  - CBC with Differential/Platelet - CMP14+EGFR - Lipid Profile - Vitamin D (25 hydroxy) - TSH + free T4  2. Attention deficit disorder (ADD) in adult Gary Price takes methyphenidate for ADHD. His symptoms are well controlled. He does not need any dose adjustment. He reports wanting to stop taking it eventually.follow up in 3 months for medication refill.  - methylphenidate 54 MG PO CR tablet; Take 1 tablet (54 mg total) by mouth every morning.  Dispense: 30 tablet; Refill: 0  3. Mixed hyperlipidemia Gary Price is taking rosuvastatin for hyperlipidemia. Lipid profile ordered. - Lipid Profile  4. Elevated blood pressure reading without diagnosis of hypertension Blood pressure 140/98 this visit. Will recheck blood pressure at follow up visit.   5. Gastroesophageal reflux disease without esophagitis History of GERD, not taking any medications for it at this time. He is staying away from foods that aggravate it.    General Counseling: Gary Price understanding of the findings of todays visit and agrees with plan of treatment. I have discussed any further diagnostic evaluation that may be needed or ordered today. We also reviewed his medications today. he has been encouraged to call the  office with any questions or concerns that should arise related to todays visit.    Orders Placed This Encounter  Procedures  . CBC with Differential/Platelet  . CMP14+EGFR  . Lipid Profile  . Vitamin D (25 hydroxy)  . TSH + free T4    Meds ordered this encounter  Medications  . methylphenidate 54 MG PO CR tablet    Sig: Take 1 tablet (54 mg total) by mouth every morning.    Dispense:  30 tablet    Refill:  0   Return in about 3 months (around 05/24/2021) for F/U, ADHD med check and BP check , Alyssa PCP.  Total time spent:30 Minutes Time spent includes review of chart, medications, test results, and follow up plan with the patient.    Controlled Substance Database was reviewed by me.  This patient was seen by Jonetta Osgood, FNP-C in collaboration with Dr. Clayborn Bigness as a part of collaborative care agreement.   Dr Lavera Guise Internal medicine

## 2021-02-22 LAB — UA/M W/RFLX CULTURE, ROUTINE
Bilirubin, UA: NEGATIVE
Glucose, UA: NEGATIVE
Ketones, UA: NEGATIVE
Leukocytes,UA: NEGATIVE
Nitrite, UA: NEGATIVE
RBC, UA: NEGATIVE
Specific Gravity, UA: >=1.03 — AB (ref 1.005–1.030)
Urobilinogen, Ur: 0.2 mg/dL (ref 0.2–1.0)
pH, UA: 5.5 (ref 5.0–7.5)

## 2021-02-22 LAB — MICROSCOPIC EXAMINATION
Bacteria, UA: NONE SEEN
Casts: NONE SEEN /lpf
Epithelial Cells (non renal): NONE SEEN /hpf (ref 0–10)
RBC, Urine: NONE SEEN /hpf (ref 0–2)
WBC, UA: NONE SEEN /hpf (ref 0–5)

## 2021-04-03 ENCOUNTER — Other Ambulatory Visit: Payer: Self-pay | Admitting: Nurse Practitioner

## 2021-04-03 DIAGNOSIS — F988 Other specified behavioral and emotional disorders with onset usually occurring in childhood and adolescence: Secondary | ICD-10-CM

## 2021-04-03 MED ORDER — METHYLPHENIDATE HCL ER (OSM) 54 MG PO TBCR: 54.0000 mg | EXTENDED_RELEASE_TABLET | ORAL | 0 refills | Status: DC

## 2021-04-03 MED ORDER — METHYLPHENIDATE HCL ER (OSM) 54 MG PO TBCR: 54.0000 mg | EXTENDED_RELEASE_TABLET | ORAL | 0 refills | Status: AC

## 2021-04-27 ENCOUNTER — Other Ambulatory Visit: Payer: Self-pay | Admitting: Internal Medicine

## 2021-04-27 DIAGNOSIS — E782 Mixed hyperlipidemia: Secondary | ICD-10-CM

## 2021-05-22 ENCOUNTER — Other Ambulatory Visit: Payer: Self-pay

## 2021-05-22 ENCOUNTER — Ambulatory Visit: Payer: 59 | Admitting: Nurse Practitioner

## 2021-05-22 ENCOUNTER — Encounter: Payer: Self-pay | Admitting: Nurse Practitioner

## 2021-05-22 VITALS — BP 138/85 | HR 92 | Temp 98.0°F | Resp 16 | Ht 75.0 in | Wt 233.0 lb

## 2021-05-22 DIAGNOSIS — Z79899 Other long term (current) drug therapy: Secondary | ICD-10-CM

## 2021-05-22 DIAGNOSIS — F988 Other specified behavioral and emotional disorders with onset usually occurring in childhood and adolescence: Secondary | ICD-10-CM | POA: Diagnosis not present

## 2021-05-22 LAB — POCT URINE DRUG SCREEN
Methylenedioxyamphetamine: NOT DETECTED
POC Amphetamine UR: NOT DETECTED
POC BENZODIAZEPINES UR: NOT DETECTED
POC Barbiturate UR: NOT DETECTED
POC Cocaine UR: NOT DETECTED
POC Ecstasy UR: NOT DETECTED
POC Marijuana UR: NOT DETECTED
POC Methadone UR: NOT DETECTED
POC Methamphetamine UR: NOT DETECTED
POC Opiate Ur: NOT DETECTED
POC Oxycodone UR: NOT DETECTED
POC PHENCYCLIDINE UR: NOT DETECTED
POC TRICYCLICS UR: NOT DETECTED

## 2021-05-22 MED ORDER — METHYLPHENIDATE HCL ER (OSM) 54 MG PO TBCR: 54.0000 mg | EXTENDED_RELEASE_TABLET | ORAL | 0 refills | Status: AC

## 2021-05-22 NOTE — Progress Notes (Addendum)
[facility] [place], [address] [address]  Internal MEDICINE  Office Visit Note  Patient Name: Gary Price  [id number]  [id number]  Date of Service: 05/22/2021  Chief Complaint  Patient presents with   Follow-up    refills   ADHD    HPI Kore presents for a follow up visit for ADHD medications. The medication and current dose are still working well for him. He denies any pain, adverse medication side effects. He has no other questions or concerns.    Current Medication: Outpatient Encounter Medications as of 05/22/2021  Medication Sig   methylphenidate 54 MG PO CR tablet Take 1 tablet (54 mg total) by mouth every morning.   rosuvastatin (CRESTOR) 5 MG tablet TAKE 1 TABLET (5 MG TOTAL) BY MOUTH DAILY.   methylphenidate 54 MG PO CR tablet Take 1 tablet (54 mg total) by mouth every morning.   [START ON 06/19/2021] methylphenidate 54 MG PO CR tablet Take 1 tablet (54 mg total) by mouth every morning.   [START ON 07/17/2021] methylphenidate 54 MG PO CR tablet Take 1 tablet (54 mg total) by mouth every morning.   [DISCONTINUED] methylphenidate 54 MG PO CR tablet Take 1 tablet (54 mg total) by mouth every morning. (Patient not taking: Reported on 05/22/2021)   No facility-administered encounter medications on file as of 05/22/2021.    Surgical History: History reviewed. No pertinent surgical history.  Medical History: Past Medical History:  Diagnosis Date   ADD (attention deficit disorder)    GERD (gastroesophageal reflux disease)    Hyperlipidemia     Family History: Family History  Problem Relation Age of Onset   Diabetes Father    Cancer Paternal Grandmother     Social History   Socioeconomic History   Marital status: Single    Spouse name: Not on file   Number of children: Not on file   Years of education: Not on file   Highest education level: Not on file  Occupational History   Not on file  Tobacco Use   Smoking status: Never    Smokeless tobacco: Never  Vaping Use   Vaping Use: Never used  Substance and Sexual Activity   Alcohol use: No   Drug use: No   Sexual activity: Not on file  Other Topics Concern   Not on file  Social History Narrative   Not on file   Social Determinants of Health   Financial Resource Strain: Not on file  Food Insecurity: Not on file  Transportation Needs: Not on file  Physical Activity: Not on file  Stress: Not on file  Social Connections: Not on file  Intimate Partner Violence: Not on file      Review of Systems  Constitutional:  Negative for chills, fatigue and unexpected weight change.  HENT:  Negative for congestion, rhinorrhea, sneezing and sore throat.   Eyes:  Negative for redness.  Respiratory:  Negative for cough, chest tightness and shortness of breath.   Cardiovascular:  Negative for chest pain and palpitations.  Gastrointestinal:  Negative for abdominal pain, constipation, diarrhea, nausea and vomiting.  Genitourinary:  Negative for dysuria and frequency.  Musculoskeletal:  Negative for arthralgias, back pain, joint swelling and neck pain.  Skin:  Negative for rash.  Neurological: Negative.  Negative for tremors and numbness.  Hematological:  Negative for adenopathy. Does not bruise/bleed easily.  Psychiatric/Behavioral:  Negative for behavioral problems (Depression), sleep disturbance and suicidal ideas. The patient is not nervous/anxious.  Vital Signs: BP 138/85 Comment: 148/102  Pulse 92 Comment: 100  Temp 98 F (36.7 C)   Resp 16   Ht '6\' 3"'$  (1.905 m)   Wt 233 lb (105.7 kg)   SpO2 97%   BMI 29.12 kg/m    Physical Exam Vitals reviewed.  Constitutional:      Appearance: Normal appearance. He is obese.  HENT:     Head: Normocephalic and atraumatic.  Cardiovascular:     Rate and Rhythm: Normal rate and regular rhythm.  Pulmonary:     Effort: Pulmonary effort is normal. No respiratory distress.  Neurological:     Mental Status: He is alert  and oriented to person, place, and time.  Psychiatric:        Mood and Affect: Mood normal.        Behavior: Behavior normal.     Assessment/Plan: 1. Encounter for long-term (current) use of medications UDS was negative, the test does not test for methylphenidate - POCT Urine Drug Screen  2. Attention deficit disorder (ADD) in adult 3 months of refills sent to pharmacy.  - methylphenidate 54 MG PO CR tablet; Take 1 tablet (54 mg total) by mouth every morning.  Dispense: 30 tablet; Refill: 0 - methylphenidate 54 MG PO CR tablet; Take 1 tablet (54 mg total) by mouth every morning.  Dispense: 30 tablet; Refill: 0 - methylphenidate 54 MG PO CR tablet; Take 1 tablet (54 mg total) by mouth every morning.  Dispense: 30 tablet; Refill: 0   General Counseling: Ceasar verbalizes understanding of the findings of todays visit and agrees with plan of treatment. I have discussed any further diagnostic evaluation that may be needed or ordered today. We also reviewed his medications today. he has been encouraged to call the office with any questions or concerns that should arise related to todays visit.    Orders Placed This Encounter  Procedures   POCT Urine Drug Screen    Meds ordered this encounter  Medications   methylphenidate 54 MG PO CR tablet    Sig: Take 1 tablet (54 mg total) by mouth every morning.    Dispense:  30 tablet    Refill:  0   methylphenidate 54 MG PO CR tablet    Sig: Take 1 tablet (54 mg total) by mouth every morning.    Dispense:  30 tablet    Refill:  0   methylphenidate 54 MG PO CR tablet    Sig: Take 1 tablet (54 mg total) by mouth every morning.    Dispense:  30 tablet    Refill:  0    Return in about 3 months (around 08/22/2021) for F/U, ADHD med check, Priscille Shadduck PCP.   Total time spent:20 Minutes Time spent includes review of chart, medications, test results, and follow up plan with the patient.   [place] Controlled Substance Database was reviewed by  me.  This patient was seen by Jonetta Osgood, FNP-C in collaboration with Dr. Clayborn Bigness as a part of collaborative care agreement.   Jaslynn Thome R. Valetta Fuller, MSN, FNP-C Internal medicine

## 2021-05-29 ENCOUNTER — Ambulatory Visit: Payer: 59 | Admitting: Podiatry

## 2021-08-21 ENCOUNTER — Ambulatory Visit: Payer: 59 | Admitting: Nurse Practitioner

## 2022-02-25 ENCOUNTER — Encounter: Payer: 59 | Admitting: Nurse Practitioner
